# Patient Record
Sex: Female | Born: 1970 | Hispanic: Yes | Marital: Married | Smoking: Never smoker
Health system: Southern US, Community
[De-identification: ages and names within clinical notes are randomized; demographics above are authoritative.]

## PROBLEM LIST (undated history)

## (undated) DIAGNOSIS — T7840XA Allergy, unspecified, initial encounter: Secondary | ICD-10-CM

## (undated) HISTORY — DX: Allergy, unspecified, initial encounter: T78.40XA

---

## 2008-09-19 ENCOUNTER — Encounter: Admission: RE | Admit: 2008-09-19 | Discharge: 2008-09-19 | Payer: Self-pay | Admitting: Specialist

## 2009-10-20 ENCOUNTER — Encounter: Admission: RE | Admit: 2009-10-20 | Discharge: 2009-10-20 | Payer: Self-pay | Admitting: Nephrology

## 2011-09-09 ENCOUNTER — Emergency Department (HOSPITAL_COMMUNITY)
Admission: EM | Admit: 2011-09-09 | Discharge: 2011-09-09 | Disposition: A | Discharge status: Home / Self Care | Payer: BC Managed Care – PPO | Attending: Emergency Medicine | Admitting: Emergency Medicine

## 2011-09-09 ENCOUNTER — Encounter (HOSPITAL_COMMUNITY): Payer: Self-pay | Admitting: *Deleted

## 2011-09-09 DIAGNOSIS — M79609 Pain in unspecified limb: Secondary | ICD-10-CM | POA: Insufficient documentation

## 2011-09-09 DIAGNOSIS — IMO0002 Reserved for concepts with insufficient information to code with codable children: Secondary | ICD-10-CM | POA: Insufficient documentation
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
## 2011-09-09 NOTE — Discharge Instructions (Signed)
Assault, General Assault includes any behavior, whether intentional or reckless, which results in bodily injury to another person and/or damage to property. Included in this would be any behavior, intentional or reckless, that by its nature would be understood (interpreted) by a reasonable person as intent to harm another person or to damage his/her property. Threats may be oral or written. They may be communicated through regular mail, computer, fax, or phone. These threats may be direct or implied. FORMS OF ASSAULT INCLUDE:  Physically assaulting a person. This includes physical threats to inflict physical harm as well as:   Slapping.   Hitting.   Poking.   Kicking.   Punching.   Pushing.   Arson.   Sabotage.   Equipment vandalism.   Damaging or destroying property.   Throwing or hitting objects.   Displaying a weapon or an object that appears to be a weapon in a threatening manner.   Carrying a firearm of any kind.   Using a weapon to harm someone.   Using greater physical size/strength to intimidate another.   Making intimidating or threatening gestures.   Bullying.   Hazing.   Intimidating, threatening, hostile, or abusive language directed toward another person.   It communicates the intention to engage in violence against that person. And it leads a reasonable person to expect that violent behavior may occur.   Stalking another person.  IF IT HAPPENS AGAIN:  Immediately call for emergency help (911 in [place]   If someone poses clear and immediate danger to you, seek legal authorities to have a protective or restraining order put in place.   Less threatening assaults can at least be reported to authorities.  STEPS TO TAKE IF A SEXUAL ASSAULT HAS HAPPENED  Go to an area of safety. This may include a shelter or staying with a friend. Stay away from the area where you have been attacked. A large percentage of sexual assaults are caused by a friend, relative  or associate.   If medications were given by your caregiver, take them as directed for the full length of time prescribed.   Only take over-the-counter or prescription medicines for pain, discomfort, or fever as directed by your caregiver.   If you have come in contact with a sexual disease, find out if you are to be tested again. If your caregiver is concerned about the HIV/AIDS virus, he/she may require you to have continued testing for several months.   For the protection of your privacy, test results can not be given over the phone. Make sure you receive the results of your test. If your test results are not back during your visit, make an appointment with your caregiver to find out the results. Do not assume everything is normal if you have not heard from your caregiver or the medical facility. It is important for you to follow up on all of your test results.   File appropriate papers with authorities. This is important in all assaults, even if it has occurred in a family or by a friend.  SEEK MEDICAL CARE IF:  You have new problems because of your injuries.   You have problems that may be because of the medicine you are taking, such as:   Rash.   Itching.   Swelling.   Trouble breathing.   You develop belly (abdominal) pain, feel sick to your stomach (nausea) or are vomiting.   You begin to run a temperature.   You need supportive care or referral to   a rape crisis center. These are centers with trained personnel who can help you get through this ordeal.  SEEK IMMEDIATE MEDICAL CARE IF:  You are afraid of being threatened, beaten, or abused. In [place] call 911.   You receive new injuries related to abuse.   You develop severe pain in any area injured in the assault or have any change in your condition that concerns you.   You faint or lose consciousness.   You develop chest pain or shortness of breath.  Document Released: 05/13/2005 Document Revised: 05/02/2011 Document  Reviewed: 12/30/2007 Exit[employer] Patient Information 2012 [employer]  RESOURCE GUIDE  Dental Problems  Patients with Medicaid: [facility]                     [facility] [address]                                           [address] Phone:  [phone]                                                  Phone:  [phone]  If unable to pay or uninsured, contact:  [facility] or [facility] to become qualified for the adult dental clinic.  Chronic Pain Problems Contact [facility]  [phone] Patients need to be referred by their primary care doctor.  Insufficient Money for Medicine Contact [employer] Way:  call "211" or [facility] 271-5999.  No Primary Care Doctor Call [facility]  832-8000 Other agencies that provide inexpensive medical care    [facility]  [phone]    [facility]  [phone]    [facility]  [phone]    [facility] Clinic  [phone]    [facility]  [phone]    [facility]  [phone]  Psychological Services [facility]  [phone] [facility]  [phone] [facility]   800 853-5163 (emergency services 641-4993)  Substance Abuse Resources [facility]  336-882-2125 [facility] 336-784-9470 [facility] 336-285-9073 [facility] 336-845-3988 Residential & Outpatient Substance Abuse Program  800-659-3381  Abuse/Neglect [place] Child Abuse Hotline (336) 641-3795 [place] Child Abuse Hotline 800-378-5315 (After Hours)  Emergency Shelter [facility] (336) 271-5985  [facility] (336) 275-9566 [facility] (704) 372-4663  MRSA Hotline #:   832-7006    [place] [facility]  [facility] [place]     [employer]                          [facility]  Dept. [address] [place]                       [address]      [address]  [place]                                                  [place]                            [place] Phone:  [phone]                                   Phone:  [phone]                 Phone:  [phone]  [facility] Phone:  [phone]  [place] Child Abuse Hotline (336) 342-1394 (336) 342-3537 (After Hours)   

## 2013-08-09 ENCOUNTER — Other Ambulatory Visit: Payer: Self-pay

## 2013-08-09 ENCOUNTER — Ambulatory Visit
Admission: RE | Admit: 2013-08-09 | Discharge: 2013-08-09 | Disposition: A | Discharge status: Home / Self Care | Payer: BC Managed Care – PPO | Source: Ambulatory Visit

## 2013-08-09 DIAGNOSIS — Z1231 Encounter for screening mammogram for malignant neoplasm of breast: Secondary | ICD-10-CM

## 2014-06-23 ENCOUNTER — Ambulatory Visit (INDEPENDENT_AMBULATORY_CARE_PROVIDER_SITE_OTHER): Payer: BLUE CROSS/BLUE SHIELD

## 2014-06-23 ENCOUNTER — Ambulatory Visit (INDEPENDENT_AMBULATORY_CARE_PROVIDER_SITE_OTHER): Payer: BLUE CROSS/BLUE SHIELD | Admitting: Emergency Medicine

## 2014-06-23 VITALS — BP 126/70 | HR 115 | Temp 99.3°F | Resp 16 | Ht <= 58 in | Wt 103.0 lb

## 2014-06-23 DIAGNOSIS — H938X3 Other specified disorders of ear, bilateral: Secondary | ICD-10-CM

## 2014-06-23 DIAGNOSIS — R0989 Other specified symptoms and signs involving the circulatory and respiratory systems: Secondary | ICD-10-CM

## 2014-06-23 DIAGNOSIS — J22 Unspecified acute lower respiratory infection: Secondary | ICD-10-CM

## 2014-06-23 DIAGNOSIS — J988 Other specified respiratory disorders: Secondary | ICD-10-CM

## 2014-06-23 MED ORDER — IPRATROPIUM BROMIDE 0.03 % NA SOLN: 2.0000 | Freq: Two times a day (BID) | NASAL | Status: DC

## 2014-06-23 MED ORDER — AZITHROMYCIN 250 MG PO TABS: ORAL_TABLET | ORAL | Status: DC

## 2014-06-23 NOTE — Progress Notes (Signed)
  Medical screening examination/treatment/procedure(s) were performed by non-physician practitioner and as supervising physician I was immediately available for consultation/collaboration.     

## 2014-06-23 NOTE — Progress Notes (Signed)
Subjective:    Patient ID: Tara Mullins, female    DOB: 04-Tara-1972, 44 y.o.   MRN: [id number]  HPI Patient presents for 2 days of productive cough and ear pain. Cough produces think yellow sputum and sounds like she is breathing differently. Feels pressure in ears and sinus. Thinks sounds are muted. Has additional sx of chest and nasal congestion, SOB, and feeling hot. Denies wheezing, CP, N/V, or HA. 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 Has tried steam bath on face with minimal relief. No h/o asthma or allergies. NKDA.   Review of Systems  Constitutional: Positive for fever. Negative for chills, activity change, appetite change and fatigue.  HENT: Positive for congestion, ear pain, hearing loss, rhinorrhea, sinus pressure, sneezing and sore throat. Negative for ear discharge, postnasal drip, tinnitus and trouble swallowing.   Eyes: Negative for pain, discharge, redness and itching.  Respiratory: Positive for cough and shortness of breath. Negative for apnea, chest tightness and wheezing.   Cardiovascular: Negative for chest pain and palpitations.  Gastrointestinal: Negative for nausea, vomiting and abdominal pain.  Allergic/Immunologic: Negative for environmental allergies and food allergies.  Neurological: Negative for dizziness, light-headedness and headaches.  Hematological: Positive for adenopathy.       Objective:   Physical Exam  Constitutional: She is oriented to person, place, and time. She appears well-nourished. No distress.  Blood pressure 126/70, pulse 115, temperature 99.3 F (37.4 C), temperature source Oral, resp. rate 16, height 4\' 10"  (1.473 m), weight 103 lb (46.72 kg), last menstrual period 05/09/2014, SpO2 94 %.  HENT:  Head: Normocephalic and atraumatic.  Right Ear: Ear canal normal. There is tenderness. No drainage or swelling. Tympanic membrane is erythematous. Tympanic membrane is not injected, not scarred, not perforated, not  retracted and not bulging. A middle ear effusion is present.  Left Ear: Ear canal normal. There is tenderness. No drainage or swelling. Tympanic membrane is not injected, not scarred, not perforated, not erythematous, not retracted and not bulging. A middle ear effusion is present.  Nose: Rhinorrhea present. Right sinus exhibits maxillary sinus tenderness. Right sinus exhibits no frontal sinus tenderness. Left sinus exhibits no maxillary sinus tenderness and no frontal sinus tenderness.  Mouth/Throat: Uvula is midline, oropharynx is clear and moist and mucous membranes are normal.  Eyes: Conjunctivae are normal. Pupils are equal, round, and reactive to light. Right eye exhibits no discharge. Left eye exhibits no discharge. No scleral icterus.  Neck: Normal range of motion. Neck supple. No thyromegaly present.  Cardiovascular: Normal rate, regular rhythm and normal heart sounds.  Exam reveals no gallop and no friction rub.   No murmur heard. Pulmonary/Chest: Effort normal. No respiratory distress. She has no decreased breath sounds. She has no wheezes. She has no rhonchi. She has rales (fine) in the right middle field and the right lower field. She exhibits no tenderness.  Abdominal: Soft. Bowel sounds are normal. She exhibits no distension. There is no tenderness.  Lymphadenopathy:    She has cervical adenopathy.  Neurological: She is alert and oriented to person, place, and time.  Skin: Skin is warm and dry. No rash noted. She is not diaphoretic. No erythema. No pallor.   [facility] reading (PRIMARY) by  Dr. Ouida Sills. No acute cardiopulmonary findings.     Assessment & Plan:  1. Lung crackles 2. Lower respiratory infection Plenty of fluid and rest. - DG Chest 2 View; Future - azithromycin (ZITHROMAX) 250 MG tablet; Take 2 tabs PO x 1  dose, then 1 tab PO QD x 4 days  Dispense: 6 tablet; Refill: 0 - wrote letter to return to work 06/25/14.  3. Ear fullness, bilateral - ipratropium (ATROVENT) 0.03  % nasal spray; Place 2 sprays into both nostrils 2 (two) times daily.  Dispense: 30 mL; Refill: 0   Edmon Magid PA-C  [facility] 06/23/2014 1:51 PM

## 2014-07-14 ENCOUNTER — Other Ambulatory Visit: Payer: Self-pay

## 2014-08-10 ENCOUNTER — Other Ambulatory Visit: Payer: Self-pay

## 2014-08-10 DIAGNOSIS — Z1231 Encounter for screening mammogram for malignant neoplasm of breast: Secondary | ICD-10-CM

## 2014-08-12 ENCOUNTER — Ambulatory Visit
Admission: RE | Admit: 2014-08-12 | Discharge: 2014-08-12 | Disposition: A | Discharge status: Home / Self Care | Payer: BLUE CROSS/BLUE SHIELD | Source: Ambulatory Visit | Attending: Family Medicine | Admitting: Family Medicine

## 2014-08-12 DIAGNOSIS — Z1231 Encounter for screening mammogram for malignant neoplasm of breast: Secondary | ICD-10-CM

## 2014-10-18 ENCOUNTER — Emergency Department (HOSPITAL_COMMUNITY)
Admission: EM | Admit: 2014-10-18 | Discharge: 2014-10-18 | Disposition: A | Discharge status: Home / Self Care | Payer: BLUE CROSS/BLUE SHIELD | Attending: Emergency Medicine | Admitting: Emergency Medicine

## 2014-10-18 ENCOUNTER — Encounter (HOSPITAL_COMMUNITY): Payer: Self-pay | Admitting: Cardiology

## 2014-10-18 ENCOUNTER — Emergency Department (HOSPITAL_COMMUNITY): Payer: BLUE CROSS/BLUE SHIELD

## 2014-10-18 DIAGNOSIS — S0990XA Unspecified injury of head, initial encounter: Secondary | ICD-10-CM | POA: Insufficient documentation

## 2014-10-18 DIAGNOSIS — S4992XA Unspecified injury of left shoulder and upper arm, initial encounter: Secondary | ICD-10-CM | POA: Insufficient documentation

## 2014-10-18 DIAGNOSIS — Y92410 Unspecified street and highway as the place of occurrence of the external cause: Secondary | ICD-10-CM | POA: Diagnosis not present

## 2014-10-18 DIAGNOSIS — S8001XA Contusion of right knee, initial encounter: Secondary | ICD-10-CM | POA: Diagnosis not present

## 2014-10-18 DIAGNOSIS — Y9389 Activity, other specified: Secondary | ICD-10-CM | POA: Insufficient documentation

## 2014-10-18 DIAGNOSIS — R51 Headache: Secondary | ICD-10-CM

## 2014-10-18 DIAGNOSIS — S161XXA Strain of muscle, fascia and tendon at neck level, initial encounter: Secondary | ICD-10-CM | POA: Diagnosis not present

## 2014-10-18 DIAGNOSIS — R519 Headache, unspecified: Secondary | ICD-10-CM

## 2014-10-18 DIAGNOSIS — Z79899 Other long term (current) drug therapy: Secondary | ICD-10-CM | POA: Diagnosis not present

## 2014-10-18 DIAGNOSIS — Z3202 Encounter for pregnancy test, result negative: Secondary | ICD-10-CM | POA: Insufficient documentation

## 2014-10-18 DIAGNOSIS — Y998 Other external cause status: Secondary | ICD-10-CM | POA: Diagnosis not present

## 2014-10-18 LAB — POC URINE PREG, ED: PREG TEST UR: NEGATIVE

## 2014-10-18 MED ORDER — NAPROXEN 500 MG PO TABS: 500.0000 mg | ORAL_TABLET | Freq: Two times a day (BID) | ORAL | Status: DC
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
## 2014-10-18 NOTE — ED Notes (Signed)
Patient states did not take anything for pain this morning.
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
## 2015-03-09 ENCOUNTER — Other Ambulatory Visit (HOSPITAL_COMMUNITY)
Admission: RE | Admit: 2015-03-09 | Discharge: 2015-03-09 | Disposition: A | Discharge status: Home / Self Care | Payer: BLUE CROSS/BLUE SHIELD | Source: Ambulatory Visit | Attending: Family Medicine | Admitting: Family Medicine

## 2015-03-09 ENCOUNTER — Other Ambulatory Visit: Payer: Self-pay | Admitting: Family Medicine

## 2015-03-09 DIAGNOSIS — Z01411 Encounter for gynecological examination (general) (routine) with abnormal findings: Secondary | ICD-10-CM | POA: Diagnosis not present

## 2015-03-14 ENCOUNTER — Other Ambulatory Visit: Payer: Self-pay | Admitting: Nurse Practitioner

## 2015-03-14 DIAGNOSIS — R102 Pelvic and perineal pain: Secondary | ICD-10-CM

## 2015-03-14 LAB — CYTOLOGY - PAP

## 2015-03-16 ENCOUNTER — Ambulatory Visit
Admission: RE | Admit: 2015-03-16 | Discharge: 2015-03-16 | Disposition: A | Discharge status: Home / Self Care | Payer: BLUE CROSS/BLUE SHIELD | Source: Ambulatory Visit | Attending: Nurse Practitioner | Admitting: Nurse Practitioner

## 2015-03-16 ENCOUNTER — Inpatient Hospital Stay (HOSPITAL_COMMUNITY)
Admission: EM | Admit: 2015-03-16 | Discharge: 2015-03-18 | DRG: 340 | Disposition: A | Discharge status: Home / Self Care | Payer: BLUE CROSS/BLUE SHIELD | Attending: Surgery | Admitting: Surgery

## 2015-03-16 ENCOUNTER — Encounter (HOSPITAL_COMMUNITY): Payer: Self-pay | Admitting: Neurology

## 2015-03-16 DIAGNOSIS — K353 Acute appendicitis with localized peritonitis, without perforation or gangrene: Secondary | ICD-10-CM

## 2015-03-16 DIAGNOSIS — R102 Pelvic and perineal pain: Secondary | ICD-10-CM

## 2015-03-16 DIAGNOSIS — K3580 Unspecified acute appendicitis: Secondary | ICD-10-CM | POA: Diagnosis present

## 2015-03-16 LAB — CBC
HCT: 40.5 % (ref 36.0–46.0)
Hemoglobin: 12.9 g/dL (ref 12.0–15.0)
MCH: 29.3 pg (ref 26.0–34.0)
MCHC: 31.9 g/dL (ref 30.0–36.0)
MCV: 92 fL (ref 78.0–100.0)
Platelets: 410 10*3/uL — ABNORMAL HIGH (ref 150–400)
RBC: 4.4 MIL/uL (ref 3.87–5.11)
RDW: 12.3 % (ref 11.5–15.5)
WBC: 6.1 10*3/uL (ref 4.0–10.5)

## 2015-03-16 LAB — COMPREHENSIVE METABOLIC PANEL
ALT: 25 U/L (ref 14–54)
AST: 17 U/L (ref 15–41)
Albumin: 3.6 g/dL (ref 3.5–5.0)
Alkaline Phosphatase: 204 U/L — ABNORMAL HIGH (ref 38–126)
Anion gap: 7 (ref 5–15)
BUN: 9 mg/dL (ref 6–20)
CHLORIDE: 107 mmol/L (ref 101–111)
CO2: 26 mmol/L (ref 22–32)
CREATININE: 0.5 mg/dL (ref 0.44–1.00)
Calcium: 9.3 mg/dL (ref 8.9–10.3)
GFR calc Af Amer: >60 mL/min (ref >60)
GFR calc non Af Amer: >60 mL/min (ref >60)
Glucose, Bld: 97 mg/dL (ref 65–99)
Potassium: 3.5 mmol/L (ref 3.5–5.1)
SODIUM: 140 mmol/L (ref 135–145)
Total Bilirubin: 0.3 mg/dL (ref 0.3–1.2)
Total Protein: 7.3 g/dL (ref 6.5–8.1)

## 2015-03-16 LAB — LIPASE, BLOOD: LIPASE: 26 U/L (ref 11–51)

## 2015-03-16 LAB — URINE MICROSCOPIC-ADD ON

## 2015-03-16 LAB — URINALYSIS, ROUTINE W REFLEX MICROSCOPIC
BILIRUBIN URINE: NEGATIVE
Glucose, UA: NEGATIVE mg/dL
Ketones, ur: NEGATIVE mg/dL
Leukocytes, UA: NEGATIVE
Nitrite: NEGATIVE
PH: 5.5 (ref 5.0–8.0)
Protein, ur: NEGATIVE mg/dL
SPECIFIC GRAVITY, URINE: 1.027 (ref 1.005–1.030)
UROBILINOGEN UA: 0.2 mg/dL (ref 0.0–1.0)

## 2015-03-16 LAB — I-STAT BETA HCG BLOOD, ED (MC, WL, AP ONLY)

## 2015-03-16 LAB — SURGICAL PCR SCREEN
MRSA, PCR: NEGATIVE
Staphylococcus aureus: NEGATIVE

## 2015-03-16 MED ORDER — ACETAMINOPHEN 650 MG RE SUPP: 650.0000 mg | Freq: Four times a day (QID) | RECTAL | Status: DC | PRN

## 2015-03-16 MED ORDER — MORPHINE SULFATE (PF) 2 MG/ML IV SOLN
1.0000 mg | INTRAVENOUS | Status: DC | PRN
  Administered 2015-03-17: 2 mg via INTRAVENOUS
  Filled 2015-03-16: qty 1

## 2015-03-16 MED ORDER — CEFTRIAXONE SODIUM 2 G IJ SOLR
2.0000 g | Freq: Once | INTRAMUSCULAR | Status: DC
Start: 2015-03-16 — End: 2015-03-17
  Filled 2015-03-16: qty 2

## 2015-03-16 MED ORDER — ENOXAPARIN SODIUM 40 MG/0.4ML ~~LOC~~ SOLN
40.0000 mg | Freq: Once | SUBCUTANEOUS | Status: AC
  Administered 2015-03-16: 40 mg via SUBCUTANEOUS
  Filled 2015-03-16: qty 0.4

## 2015-03-16 MED ORDER — IOPAMIDOL (ISOVUE-300) INJECTION 61%
100.0000 mL | Freq: Once | INTRAVENOUS | Status: AC | PRN
  Administered 2015-03-16: 100 mL via INTRAVENOUS

## 2015-03-16 MED ORDER — DIPHENHYDRAMINE HCL 50 MG/ML IJ SOLN: 25.0000 mg | Freq: Four times a day (QID) | INTRAMUSCULAR | Status: DC | PRN

## 2015-03-16 MED ORDER — DEXTROSE 5 % IV SOLN
2.0000 g | INTRAVENOUS | Status: DC
  Administered 2015-03-16: 2 g via INTRAVENOUS
  Filled 2015-03-16 (×2): qty 2

## 2015-03-16 MED ORDER — ONDANSETRON 4 MG PO TBDP: 4.0000 mg | ORAL_TABLET | Freq: Four times a day (QID) | ORAL | Status: DC | PRN

## 2015-03-16 MED ORDER — KCL IN DEXTROSE-NACL 20-5-0.45 MEQ/L-%-% IV SOLN
INTRAVENOUS | Status: DC
  Administered 2015-03-16 – 2015-03-17 (×2): via INTRAVENOUS
  Filled 2015-03-16 (×2): qty 1000

## 2015-03-16 MED ORDER — ACETAMINOPHEN 325 MG PO TABS
650.0000 mg | ORAL_TABLET | Freq: Four times a day (QID) | ORAL | Status: DC | PRN
  Administered 2015-03-16: 325 mg via ORAL
  Filled 2015-03-16: qty 2

## 2015-03-16 MED ORDER — ONDANSETRON HCL 4 MG/2ML IJ SOLN
4.0000 mg | Freq: Four times a day (QID) | INTRAMUSCULAR | Status: DC | PRN
  Administered 2015-03-17 (×2): 4 mg via INTRAVENOUS
  Filled 2015-03-16 (×2): qty 2

## 2015-03-16 MED ORDER — METRONIDAZOLE IN NACL 5-0.79 MG/ML-% IV SOLN
500.0000 mg | Freq: Three times a day (TID) | INTRAVENOUS | Status: DC
  Administered 2015-03-16 – 2015-03-17 (×3): 500 mg via INTRAVENOUS
  Filled 2015-03-16 (×4): qty 100

## 2015-03-16 MED ORDER — OXYCODONE HCL 5 MG PO TABS: 5.0000 mg | ORAL_TABLET | ORAL | Status: DC | PRN

## 2015-03-16 MED ORDER — DIPHENHYDRAMINE HCL 25 MG PO CAPS: 25.0000 mg | ORAL_CAPSULE | Freq: Four times a day (QID) | ORAL | Status: DC | PRN

## 2015-03-16 MED ORDER — SIMETHICONE 80 MG PO CHEW: 40.0000 mg | CHEWABLE_TABLET | Freq: Four times a day (QID) | ORAL | Status: DC | PRN

## 2015-03-16 MED ORDER — METRONIDAZOLE IN NACL 5-0.79 MG/ML-% IV SOLN
500.0000 mg | Freq: Once | INTRAVENOUS | Status: DC
  Filled 2015-03-16: qty 100

## 2015-03-16 NOTE — H&P (Signed)
[place] [birth year]  [id number].   Chief Complaint/Reason for Consult: acute appendicitis HPI: This is a 44 yo female who began having RLQ abdominal pain since Oct 10 around 9pm at night.  She saw her OB/GYN and they did an [place] which showed free pelvic fluid from possible ruptured cyst.  She has continued to have RLQ abdominal pain, but denies N/V or fevers.  She has not had diarrhea until today after her CT scan contrast.  She saw her ob/gyn NP again today and she was sent for a CT scan.  This revealed acute appendicitis, but no definite evidence of perforation.  She was sent to [facility] and we have been asked to see her for admission.  ROS : Please see HPI, otherwise all other systems are negative.  History reviewed. No pertinent family history.  Past Medical History  Diagnosis Date  . Allergy     History reviewed. No pertinent past surgical history.  Social History:  reports that she has never smoked. She does not have any smokeless tobacco history on file. She reports that she does not drink alcohol or use illicit drugs.  Allergies: No Known Allergies   (Not in a hospital admission)  Blood pressure 113/54, pulse 79, temperature 98.6 F (37 C), temperature source Oral, resp. rate 20, last menstrual period 02/02/2015, SpO2 96 %. Physical Exam: General: pleasant, WD, WN  female who is laying in bed in NAD HEENT: head is normocephalic, atraumatic.  Sclera are noninjected.  PERRL.  Ears and nose without any masses or lesions.  Mouth is pink and moist Heart: regular, rate, and rhythm.  Normal s1,s2. No obvious murmurs, gallops, or rubs noted.  Palpable radial and pedal pulses bilaterally Lungs: CTAB, no wheezes, rhonchi, or rales noted.  Respiratory effort nonlabored Abd: soft, mildly tender in RLQ, no rebounding or peritonitis, ND, +BS, no masses, hernias, or organomegaly MS: all 4 extremities are symmetrical with no cyanosis, clubbing, or edema. Skin: warm and dry with no  masses, lesions, or rashes Psych: A&Ox3 with an appropriate affect.    Results for orders placed or performed during the hospital encounter of 03/16/15 (from the past 48 hour(s))  Lipase, blood     Status: None   Collection Time: 03/16/15  1:00 PM  Result Value Ref Range   Lipase 26 11 - 51 U/L    Comment: Please note change in reference range.  Comprehensive metabolic panel     Status: Abnormal   Collection Time: 03/16/15  1:00 PM  Result Value Ref Range   Sodium 140 135 - 145 mmol/L   Potassium 3.5 3.5 - 5.1 mmol/L   Chloride 107 101 - 111 mmol/L   CO2 26 22 - 32 mmol/L   Glucose, Bld 97 65 - 99 mg/dL   BUN 9 6 - 20 mg/dL   Creatinine, Ser 0.50 0.44 - 1.00 mg/dL   Calcium 9.3 8.9 - 10.3 mg/dL   Total Protein 7.3 6.5 - 8.1 g/dL   Albumin 3.6 3.5 - 5.0 g/dL   AST 17 15 - 41 U/L   ALT 25 14 - 54 U/L   Alkaline Phosphatase 204 (H) 38 - 126 U/L   Total Bilirubin 0.3 0.3 - 1.2 mg/dL   GFR calc non Af Amer >60 >60 mL/min   GFR calc Af Amer >60 >60 mL/min    Comment: (NOTE) The eGFR has been calculated using the CKD EPI equation. This calculation has not been validated in all clinical situations. eGFR's persistently <60  mL/min signify possible Chronic Kidney Disease.    Anion gap 7 5 - 15  CBC     Status: Abnormal   Collection Time: 03/16/15  1:00 PM  Result Value Ref Range   WBC 6.1 4.0 - 10.5 K/uL   RBC 4.40 3.87 - 5.11 MIL/uL   Hemoglobin 12.9 12.0 - 15.0 g/dL   HCT 40.5 36.0 - 46.0 %   MCV 92.0 78.0 - 100.0 fL   MCH 29.3 26.0 - 34.0 pg   MCHC 31.9 30.0 - 36.0 g/dL   RDW 12.3 11.5 - 15.5 %   Platelets 410 (H) 150 - 400 K/uL  I-Stat beta hCG blood, ED ([facility], [facility], [facility] only)     Status: None   Collection Time: 03/16/15  1:06 PM  Result Value Ref Range   I-stat hCG, quantitative <5.0 <5 mIU/mL   Comment 3            Comment:   GEST. AGE      CONC.  (mIU/mL)   <=1 WEEK        5 - 50     2 WEEKS       50 - 500     3 WEEKS       100 - 10,000     4 WEEKS     1,000 -  30,000        FEMALE AND NON-PREGNANT FEMALE:     LESS THAN 5 mIU/mL    Ct Abdomen Pelvis W Contrast  03/16/2015  CLINICAL DATA:  Severe sharp pelvic pain for 10 days EXAM: CT ABDOMEN AND PELVIS WITH CONTRAST TECHNIQUE: Multidetector CT imaging of the abdomen and pelvis was performed using the standard protocol following bolus administration of intravenous contrast. CONTRAST:  142m ISOVUE-300 IOPAMIDOL (ISOVUE-300) INJECTION 61% COMPARISON:  None. FINDINGS: The lung bases are clear. The liver enhances with no focal abnormality and no ductal dilatation is seen. The gallbladder is somewhat contracted and no calcified gallstones are seen. The pancreas is unremarkable. The adrenal glands and spleen are unremarkable. The stomach is moderately fluid distended with no abnormality noted. The kidneys enhance with no calculus or mass and on delayed images, the pelvocaliceal systems are unremarkable, and the proximal ureters are normal in caliber. The abdominal aorta is normal in caliber. No adenopathy is seen. The urinary bladder is decompressed and cannot be well evaluated. The uterus is normal in size. However, there are low-attenuation structures within the right adnexa with an adjacent inflammatory process and some fluid. On coronal images this appears to be due to a dilated fluid-filled appendix extending toward the right adnexa, consistent with acute appendicitis. The appendix measures up to 10 mm in diameter, and no evidence of rupture is currently seen. There is a small amount of fluid in the left adnexa as well. No abnormality of the colon is noted. The terminal ileum is unremarkable. The lumbar vertebrae are normal alignment with normal intervertebral disc spaces. IMPRESSION: Dilated fluid filled appendix extending into the right adnexa with adjacent inflammatory change, consistent with acute appendicitis without current complication. Surgical consultation is recommended as soon as possible. These results  were called by telephone by me at the time of interpretation on 03/16/2015 at 10:39 am to Dr. ADelice Bison, who verbally acknowledged these results. Electronically Signed   By: PIvar DrapeM.D.   On: 03/16/2015 10:41       Assessment/Plan 1. Acute appendicitis -admit, IVFs, abx therapy with Rocephin/Flagyl -clear liquids tonight, NPO p MN,  and plan for lap appy tomorrow -her imaging has been reviewed with radiology as well -lovenox x 1 tonight and hold for OR tomorrow   Laia Wiley E 03/16/2015, 3:06 PM Pager: [phone]

## 2015-03-16 NOTE — ED Notes (Signed)
Pt here with appendicitis shown from CT scan done today. Has been having abd pain since last week. Denies vomiting. Pt is a x 4.

## 2015-03-16 NOTE — ED Notes (Signed)
Surgeon at bedside.  

## 2015-03-17 ENCOUNTER — Inpatient Hospital Stay (HOSPITAL_COMMUNITY): Payer: BLUE CROSS/BLUE SHIELD | Admitting: Anesthesiology

## 2015-03-17 ENCOUNTER — Encounter (HOSPITAL_COMMUNITY): Payer: Self-pay | Admitting: Anesthesiology

## 2015-03-17 ENCOUNTER — Encounter (HOSPITAL_COMMUNITY)
Admission: EM | Disposition: A | Discharge status: Home / Self Care | Payer: BLUE CROSS/BLUE SHIELD | Source: Home / Self Care

## 2015-03-17 HISTORY — PX: LAPAROSCOPIC APPENDECTOMY: SHX408

## 2015-03-17 LAB — BASIC METABOLIC PANEL
ANION GAP: 9 (ref 5–15)
BUN: 7 mg/dL (ref 6–20)
CALCIUM: 8.7 mg/dL — AB (ref 8.9–10.3)
CO2: 24 mmol/L (ref 22–32)
Chloride: 106 mmol/L (ref 101–111)
Creatinine, Ser: 0.63 mg/dL (ref 0.44–1.00)
GLUCOSE: 98 mg/dL (ref 65–99)
Potassium: 3.6 mmol/L (ref 3.5–5.1)
Sodium: 139 mmol/L (ref 135–145)

## 2015-03-17 LAB — CBC
HCT: 38.2 % (ref 36.0–46.0)
HEMOGLOBIN: 11.8 g/dL — AB (ref 12.0–15.0)
MCH: 28.6 pg (ref 26.0–34.0)
MCHC: 30.9 g/dL (ref 30.0–36.0)
MCV: 92.5 fL (ref 78.0–100.0)
PLATELETS: 391 10*3/uL (ref 150–400)
RBC: 4.13 MIL/uL (ref 3.87–5.11)
RDW: 12.4 % (ref 11.5–15.5)
WBC: 5 10*3/uL (ref 4.0–10.5)

## 2015-03-17 SURGERY — APPENDECTOMY, LAPAROSCOPIC
Anesthesia: General

## 2015-03-17 MED ORDER — PROPOFOL 10 MG/ML IV BOLUS
INTRAVENOUS | Status: DC | PRN
  Administered 2015-03-17: 30 mg via INTRAVENOUS
  Administered 2015-03-17: 110 mg via INTRAVENOUS
  Administered 2015-03-17: 40 mg via INTRAVENOUS

## 2015-03-17 MED ORDER — ONDANSETRON HCL 4 MG/2ML IJ SOLN
INTRAMUSCULAR | Status: DC | PRN
  Administered 2015-03-17: 4 mg via INTRAVENOUS

## 2015-03-17 MED ORDER — OXYCODONE HCL 5 MG PO TABS: 5.0000 mg | ORAL_TABLET | ORAL | Status: DC | PRN

## 2015-03-17 MED ORDER — DEXAMETHASONE SODIUM PHOSPHATE 4 MG/ML IJ SOLN
INTRAMUSCULAR | Status: DC | PRN
  Administered 2015-03-17: 4 mg via INTRAVENOUS

## 2015-03-17 MED ORDER — AMOXICILLIN-POT CLAVULANATE 875-125 MG PO TABS: 1.0000 | ORAL_TABLET | Freq: Two times a day (BID) | ORAL | Status: DC

## 2015-03-17 MED ORDER — OXYCODONE HCL 5 MG PO TABS: 5.0000 mg | ORAL_TABLET | Freq: Once | ORAL | Status: DC | PRN

## 2015-03-17 MED ORDER — FENTANYL CITRATE (PF) 100 MCG/2ML IJ SOLN
INTRAMUSCULAR | Status: DC | PRN
  Administered 2015-03-17: 50 ug via INTRAVENOUS
  Administered 2015-03-17: 150 ug via INTRAVENOUS
  Administered 2015-03-17: 50 ug via INTRAVENOUS

## 2015-03-17 MED ORDER — 0.9 % SODIUM CHLORIDE (POUR BTL) OPTIME
TOPICAL | Status: DC | PRN
  Administered 2015-03-17: 1000 mL

## 2015-03-17 MED ORDER — LIDOCAINE HCL (CARDIAC) 20 MG/ML IV SOLN
INTRAVENOUS | Status: DC | PRN
  Administered 2015-03-17: 50 mg via INTRAVENOUS

## 2015-03-17 MED ORDER — ACETAMINOPHEN 325 MG PO TABS: 650.0000 mg | ORAL_TABLET | Freq: Four times a day (QID) | ORAL | Status: DC | PRN

## 2015-03-17 MED ORDER — MIDAZOLAM HCL 5 MG/5ML IJ SOLN
INTRAMUSCULAR | Status: DC | PRN
  Administered 2015-03-17: 2 mg via INTRAVENOUS

## 2015-03-17 MED ORDER — LIDOCAINE HCL (CARDIAC) 20 MG/ML IV SOLN
INTRAVENOUS | Status: AC
  Filled 2015-03-17: qty 5

## 2015-03-17 MED ORDER — MIDAZOLAM HCL 2 MG/2ML IJ SOLN
INTRAMUSCULAR | Status: AC
  Filled 2015-03-17: qty 4

## 2015-03-17 MED ORDER — SUCCINYLCHOLINE CHLORIDE 20 MG/ML IJ SOLN
INTRAMUSCULAR | Status: DC | PRN
  Administered 2015-03-17: 40 mg via INTRAVENOUS
  Administered 2015-03-17: 60 mg via INTRAVENOUS

## 2015-03-17 MED ORDER — SODIUM CHLORIDE 0.9 % IR SOLN
Status: DC | PRN
  Administered 2015-03-17: 1000 mL

## 2015-03-17 MED ORDER — AMOXICILLIN-POT CLAVULANATE 875-125 MG PO TABS
1.0000 | ORAL_TABLET | Freq: Two times a day (BID) | ORAL | Status: DC
  Administered 2015-03-17 – 2015-03-18 (×3): 1 via ORAL
  Filled 2015-03-17 (×3): qty 1

## 2015-03-17 MED ORDER — BUPIVACAINE-EPINEPHRINE (PF) 0.5% -1:200000 IJ SOLN: INTRAMUSCULAR | Status: DC | PRN

## 2015-03-17 MED ORDER — ACETAMINOPHEN 325 MG PO TABS: 325.0000 mg | ORAL_TABLET | ORAL | Status: DC | PRN

## 2015-03-17 MED ORDER — BUPIVACAINE-EPINEPHRINE (PF) 0.5% -1:200000 IJ SOLN
INTRAMUSCULAR | Status: AC
  Filled 2015-03-17: qty 30

## 2015-03-17 MED ORDER — BUPIVACAINE-EPINEPHRINE (PF) 0.25% -1:200000 IJ SOLN
INTRAMUSCULAR | Status: AC
  Filled 2015-03-17: qty 30

## 2015-03-17 MED ORDER — BUPIVACAINE-EPINEPHRINE 0.5% -1:200000 IJ SOLN
INTRAMUSCULAR | Status: DC | PRN
Start: 2015-03-17 — End: 2015-03-17
  Administered 2015-03-17: 30 mL

## 2015-03-17 MED ORDER — OXYCODONE HCL 5 MG/5ML PO SOLN: 5.0000 mg | Freq: Once | ORAL | Status: DC | PRN

## 2015-03-17 MED ORDER — ACETAMINOPHEN 160 MG/5ML PO SOLN: 325.0000 mg | ORAL | Status: DC | PRN

## 2015-03-17 MED ORDER — PROPOFOL 10 MG/ML IV BOLUS
INTRAVENOUS | Status: AC
  Filled 2015-03-17: qty 20

## 2015-03-17 MED ORDER — ENOXAPARIN SODIUM 40 MG/0.4ML ~~LOC~~ SOLN
40.0000 mg | SUBCUTANEOUS | Status: DC
  Administered 2015-03-17: 40 mg via SUBCUTANEOUS
  Filled 2015-03-17: qty 0.4

## 2015-03-17 MED ORDER — KCL IN DEXTROSE-NACL 20-5-0.45 MEQ/L-%-% IV SOLN
INTRAVENOUS | Status: DC
  Filled 2015-03-17: qty 1000

## 2015-03-17 MED ORDER — FENTANYL CITRATE (PF) 250 MCG/5ML IJ SOLN
INTRAMUSCULAR | Status: AC
  Filled 2015-03-17: qty 5

## 2015-03-17 MED ORDER — MORPHINE SULFATE (PF) 2 MG/ML IV SOLN: 1.0000 mg | INTRAVENOUS | Status: DC | PRN

## 2015-03-17 MED ORDER — LACTATED RINGERS IV SOLN
INTRAVENOUS | Status: DC
  Administered 2015-03-17 (×2): via INTRAVENOUS

## 2015-03-17 MED ORDER — FENTANYL CITRATE (PF) 100 MCG/2ML IJ SOLN: 25.0000 ug | INTRAMUSCULAR | Status: DC | PRN

## 2015-03-17 SURGICAL SUPPLY — 38 items
APPLIER CLIP 5 13 M/L LIGAMAX5 (MISCELLANEOUS)
APPLIER CLIP ROT 10 11.4 M/L (STAPLE)
CANISTER SUCTION 2500CC (MISCELLANEOUS) ×2 IMPLANT
CHLORAPREP W/TINT 26ML (MISCELLANEOUS) ×2 IMPLANT
CLIP APPLIE 5 13 M/L LIGAMAX5 (MISCELLANEOUS) IMPLANT
CLIP APPLIE ROT 10 11.4 M/L (STAPLE) IMPLANT
COVER SURGICAL LIGHT HANDLE (MISCELLANEOUS) ×2 IMPLANT
CUTTER LINEAR ENDO 35 ART FLEX (STAPLE) IMPLANT
ELECT REM PT RETURN 9FT ADLT (ELECTROSURGICAL) ×2
ELECTRODE REM PT RTRN 9FT ADLT (ELECTROSURGICAL) ×1 IMPLANT
ENDOLOOP SUT PDS II  0 18 (SUTURE)
ENDOLOOP SUT PDS II 0 18 (SUTURE) IMPLANT
GLOVE BIOGEL PI IND STRL 7.5 (GLOVE) ×2 IMPLANT
GLOVE BIOGEL PI INDICATOR 7.5 (GLOVE) ×2
GLOVE SURG SIGNA 7.5 PF LTX (GLOVE) ×2 IMPLANT
GLOVE SURG SS PI 7.0 STRL IVOR (GLOVE) ×2 IMPLANT
GOWN STRL REUS W/ TWL LRG LVL3 (GOWN DISPOSABLE) ×2 IMPLANT
GOWN STRL REUS W/ TWL XL LVL3 (GOWN DISPOSABLE) ×1 IMPLANT
GOWN STRL REUS W/TWL LRG LVL3 (GOWN DISPOSABLE) ×2
GOWN STRL REUS W/TWL XL LVL3 (GOWN DISPOSABLE) ×1
KIT BASIN OR (CUSTOM PROCEDURE TRAY) ×2 IMPLANT
KIT ROOM TURNOVER OR (KITS) ×2 IMPLANT
LIQUID BAND (GAUZE/BANDAGES/DRESSINGS) ×2 IMPLANT
NS IRRIG 1000ML POUR BTL (IV SOLUTION) ×4 IMPLANT
PAD ARMBOARD 7.5X6 YLW CONV (MISCELLANEOUS) ×4 IMPLANT
POUCH SPECIMEN RETRIEVAL 10MM (ENDOMECHANICALS) ×4 IMPLANT
RELOAD CUTTER ETS 35MM STAND (ENDOMECHANICALS) IMPLANT
SCALPEL HARMONIC ACE (MISCELLANEOUS) ×2 IMPLANT
SET IRRIG TUBING LAPAROSCOPIC (IRRIGATION / IRRIGATOR) ×2 IMPLANT
SLEEVE ENDOPATH XCEL 5M (ENDOMECHANICALS) ×2 IMPLANT
SPECIMEN JAR SMALL (MISCELLANEOUS) ×2 IMPLANT
SUT MON AB 4-0 PC3 18 (SUTURE) ×2 IMPLANT
TOWEL OR 17X24 6PK STRL BLUE (TOWEL DISPOSABLE) ×2 IMPLANT
TOWEL OR 17X26 10 PK STRL BLUE (TOWEL DISPOSABLE) ×2 IMPLANT
TRAY LAPAROSCOPIC MC (CUSTOM PROCEDURE TRAY) ×2 IMPLANT
TROCAR XCEL BLUNT TIP 100MML (ENDOMECHANICALS) ×2 IMPLANT
TROCAR XCEL NON-BLD 5MMX100MML (ENDOMECHANICALS) ×2 IMPLANT
TUBING INSUFFLATION (TUBING) ×2 IMPLANT

## 2015-03-17 NOTE — Anesthesia Procedure Notes (Signed)
Procedure Name: Intubation Date/Time: 03/17/2015 9:01 AM Performed by: Jenne Campus Pre-anesthesia Checklist: Patient identified, Emergency Drugs available, Suction available, Patient being monitored and Timeout performed Patient Re-evaluated:Patient Re-evaluated prior to inductionOxygen Delivery Method: Circle system utilized Preoxygenation: Pre-oxygenation with 100% oxygen Intubation Type: IV induction Ventilation: Mask ventilation without difficulty Laryngoscope Size: Mac and 3 Grade View: Grade I Tube type: Oral Tube size: 7.0 mm Number of attempts: 1 Airway Equipment and Method: Stylet Placement Confirmation: ETT inserted through vocal cords under direct vision,  positive ETCO2,  CO2 detector and breath sounds checked- equal and bilateral Secured at: 20 cm Tube secured with: Tape Dental Injury: Teeth and Oropharynx as per pre-operative assessment

## 2015-03-17 NOTE — Op Note (Signed)
APPENDECTOMY LAPAROSCOPIC  Procedure Note  Tara Mullins 03/16/2015 - 03/17/2015   Pre-op Diagnosis: ruptured appendicitis     Post-op Diagnosis: same  Procedure(s): APPENDECTOMY LAPAROSCOPIC  Surgeon(s): Coralie Keens, MD  Anesthesia: General  Staff:  Circulator: Gilberto Better, RN Scrub Person: Rolan Bucco; Lars Pinks Circulator Assistant: Ma Rings, RN  Estimated Blood Loss: Minimal               Specimens: sent to path  Findings:  perforated appendix at the tip with a small, walled off abscess          [name]   Date: 03/17/2015  Time: 9:37 AM

## 2015-03-17 NOTE — Transfer of Care (Signed)
Immediate Anesthesia Transfer of Care Note  Patient: Tara Mullins  Procedure(s) Performed: Procedure(s): APPENDECTOMY LAPAROSCOPIC (N/A)  Patient Location: PACU  Anesthesia Type:General  Level of Consciousness: sedated and patient cooperative  Airway & Oxygen Therapy: Patient Spontanous Breathing and Patient connected to nasal cannula oxygen  Post-op Assessment: Report given to RN and Post -op Vital signs reviewed and stable  Post vital signs: Reviewed  Last Vitals:  Filed Vitals:   03/17/15 0757  BP: 138/57  Pulse: 78  Temp: 36.2 C  Resp: 17    Complications: No apparent anesthesia complications

## 2015-03-17 NOTE — Anesthesia Postprocedure Evaluation (Signed)
  Anesthesia Post-op Note  Patient: Tara Mullins  Procedure(s) Performed: Procedure(s): APPENDECTOMY LAPAROSCOPIC (N/A)  Patient Location: PACU  Anesthesia Type:General  Level of Consciousness: awake  Airway and Oxygen Therapy: Patient Spontanous Breathing  Post-op Pain: mild  Post-op Assessment: Post-op Vital signs reviewed, Patient's Cardiovascular Status Stable, Respiratory Function Stable, Patent Airway, No signs of Nausea or vomiting and Pain level controlled              Post-op Vital Signs: Reviewed and stable  Last Vitals:  Filed Vitals:   03/17/15 1015  BP: 122/59  Pulse: 81  Temp:   Resp: 18    Complications: No apparent anesthesia complications

## 2015-03-17 NOTE — Op Note (Signed)
NAMEMarland Mullins  GUSTAVIA, CARIE NO.:  [id number]  MEDICAL RECORD NO.:  [id number]  LOCATION:  6N21C                        FACILITY:  [facility]  PHYSICIAN:  Coralie Keens, M.D. DATE OF BIRTH:  [birth year]  DATE OF PROCEDURE:  03/17/2015 DATE OF DISCHARGE:                              OPERATIVE REPORT   PREOPERATIVE DIAGNOSIS:  Ruptured appendicitis.  POSTOPERATIVE DIAGNOSIS:  Ruptured appendicitis.  PROCEDURE:  Laparoscopic appendectomy.  SURGEON:  Coralie Keens, M.D.  ANESTHESIA:  General and 0.25% Marcaine.  ESTIMATED BLOOD LOSS:  Minimal.  INDICATIONS:  A 44 year old female, who presents with a 10-day history of right lower quadrant abdominal pain.  This was originally thought to be a ruptured ovarian cyst.  Because of her persistent pain, a CAT scan was performed showing findings consistent with appendicitis and a small periappendiceal abscess.  Decision was made to proceed to the operating room for laparoscopic appendectomy.  FINDINGS:  The patient was found to have appendicitis with perforation of the tip of the appendix and a small abscess that was contained in the retroperitoneum that had turbid-appearing fluid.  No other intraabdominal abnormalities were identified.  PROCEDURE IN DETAIL:  The patient was brought to the operating room, identified as Tara Mullins.  She was placed supine on the operating room table and general anesthesia was induced.  Her abdomen was then prepped and draped in the usual sterile fashion.  Using a #15 blade, a small vertical incision was made below the umbilicus.  This was carried down the fascia, which was then opened scalpel.  Hemostat was used to pass into the peritoneal cavity under direct vision.  A #0 Vicryl pursestring suture was then placed around the fascial opening.  The Hasson port was placed through the opening and insufflation of the abdomen was begun.  A 5-mm port was then placed in the patient's right upper  quadrant and another in the left lower quadrant all under direct vision.  The appendix was identified in the right lower quadrant.  It was adhered to the retroperitoneum and covered with omentum.  I was able to free the omentum off the appendix.  The base of the appendix was normal; however, the rest of the appendix was quite inflamed and stuck to the retroperitoneum.  I was able to take down the mesoappendix with the Harmonic Scalpel and then transect the base of the appendix with the endoscopic GIA stapler.  I was able to peel the appendix out of the retroperitoneum.  I entered a very small abscess cavity, which had turbid fluid, but no gross purulence.  Once it appeared that the appendix had ruptured just at the tip.  At this point, the appendix was completely free from retroperitoneum with blunt dissection.  I then placed the appendix in an Endosac and removed through the incision at the umbilicus.  I then copiously irrigated the abdomen with a liter of normal saline.  Hemostasis appeared to be achieved in the retroperitoneum as well as at the appendiceal stump.  At this point, I elected not to leave a drain.  Hemostasis appeared to be achieved.  All ports were removed under direct vision and the abdomen was deflated.  The #0 Vicryl was tied in place at the umbilicus, closing the fascial defect.  All incisions were then anesthetized with Marcaine and closed with 4-0 Monocryl sutures.  Skin glue was then applied.  The patient tolerated the procedure well.  All the counts were correct at the end of procedure.  The patient was then extubated in the operating room and taken in a stable condition to recovery room.     Coralie Keens, M.D.     DB/MEDQ  D:  03/17/2015  T:  03/17/2015  Job:  [id number]

## 2015-03-17 NOTE — ED Provider Notes (Signed)
CSN: [id number]     Arrival date & time 03/16/15  1232 History   First MD Initiated Contact with Patient 03/16/15 1350     Chief Complaint  Patient presents with  . Abdominal Pain     (Consider location/radiation/quality/duration/timing/severity/associated sxs/prior Treatment) HPI   Tara Mullins is a 44 y.o F with no significant past medical history who presents the emergency department today from an outpatient CT imaging that revealed appendicitis. Patient states that on October 10 and she began experiencing right lower quadrant abdominal pain and was seen in the emergency department for this patient had a transvaginal ultrasound that revealed a ruptured ovarian cyst and she followed up with her PCP on October 13. She had a Pap smear at this time which was normal. Patient still with continued abdominal pain saw her PCP again on Monday and was referred to OB/GYN. Her OB/GYN ordered her a CT scan that was performed today as an outpatient. Patient states she was called by her OB/GYN with the results of the CT scan that showed appendicitis and was told to come to the emergency Department immediately. Patient still complaining of right lower quadrant abdominal pain, anorexia, nausea. Denies fever, vomiting, diarrhea, chest pain, shortness of breath, dysuria.  Past Medical History  Diagnosis Date  . Allergy    History reviewed. No pertinent past surgical history. History reviewed. No pertinent family history. Social History  Substance Use Topics  . Smoking status: Never Smoker   . Smokeless tobacco: None  . Alcohol Use: No   OB History    No data available     Review of Systems  All other systems reviewed and are negative.     Allergies  Review of patient's allergies indicates no known allergies.  Home Medications   Prior to Admission medications   Medication Sig Start Date End Date Taking? Authorizing Provider  Ascorbic Acid (VITAMIN C) 500 MG CHEW Chew 1 tablet by mouth  daily.   Yes Historical Provider, MD  cetirizine (ZYRTEC) 10 MG tablet Take 10 mg by mouth daily.   Yes Historical Provider, MD  Cyanocobalamin (VITAMIN B12 PO) Take 1 tablet by mouth daily.   Yes Historical Provider, MD  hyoscyamine (LEVSIN, ANASPAZ) 0.125 MG tablet Take 1 tablet by mouth every 6 (six) hours as needed. cramping 03/07/15  Yes Historical Provider, MD  Multiple Vitamins-Minerals (HM MULTIVITAMIN ADULT GUMMY PO) Take 1 tablet by mouth daily.   Yes Historical Provider, MD  traMADol-acetaminophen (ULTRACET) 37.5-325 MG tablet Take 1 tablet by mouth every 6 (six) hours as needed. pain 03/07/15  Yes Historical Provider, MD   BP 114/58 mmHg  Pulse 71  Temp(Src) 98.4 F (36.9 C) (Axillary)  Resp 20  Ht 4\' 10"  (1.473 m)  Wt 100 lb (45.36 kg)  BMI 20.91 kg/m2  SpO2 97%  LMP 02/02/2015 Physical Exam  Constitutional: She is oriented to person, place, and time. She appears well-developed and well-nourished. No distress.  HENT:  Head: Normocephalic and atraumatic.  Mouth/Throat: Oropharynx is clear and moist. No oropharyngeal exudate.  Eyes: Conjunctivae and EOM are normal. Pupils are equal, round, and reactive to light. Right eye exhibits no discharge. Left eye exhibits no discharge. No scleral icterus.  Neck: Normal range of motion. Neck supple.  Cardiovascular: Normal rate, regular rhythm, normal heart sounds and intact distal pulses.  Exam reveals no gallop and no friction rub.   No murmur heard. Pulmonary/Chest: Effort normal and breath sounds normal. No respiratory distress. She has no wheezes. She  has no rales. She exhibits no tenderness.  Abdominal: Soft. Bowel sounds are normal. She exhibits no distension. There is tenderness. There is rebound and guarding.  Positive Rosvings sign, rebound.   Musculoskeletal: Normal range of motion. She exhibits no edema or tenderness.  Lymphadenopathy:    She has no cervical adenopathy.  Neurological: She is alert and oriented to person,  place, and time. No cranial nerve deficit.  Strength 5/5 throughout. No sensory deficits.    Skin: Skin is warm and dry. No rash noted. She is not diaphoretic. No erythema. No pallor.  Psychiatric: She has a normal mood and affect. Her behavior is normal.  Nursing note and vitals reviewed.   ED Course  Procedures (including critical care time) Labs Review Labs Reviewed  COMPREHENSIVE METABOLIC PANEL - Abnormal; Notable for the following:    Alkaline Phosphatase 204 (*)    All other components within normal limits  CBC - Abnormal; Notable for the following:    Platelets 410 (*)    All other components within normal limits  URINALYSIS, ROUTINE W REFLEX MICROSCOPIC (NOT AT [facility]) - Abnormal; Notable for the following:    Hgb urine dipstick SMALL (*)    All other components within normal limits  BASIC METABOLIC PANEL - Abnormal; Notable for the following:    Calcium 8.7 (*)    All other components within normal limits  CBC - Abnormal; Notable for the following:    Hemoglobin 11.8 (*)    All other components within normal limits  SURGICAL PCR SCREEN  LIPASE, BLOOD  URINE MICROSCOPIC-ADD ON  I-STAT BETA HCG BLOOD, ED ([facility], [facility], [facility] ONLY)  SURGICAL PATHOLOGY    Imaging Review Ct Abdomen Pelvis W Contrast  03/16/2015  CLINICAL DATA:  Severe sharp pelvic pain for 10 days EXAM: CT ABDOMEN AND PELVIS WITH CONTRAST TECHNIQUE: Multidetector CT imaging of the abdomen and pelvis was performed using the standard protocol following bolus administration of intravenous contrast. CONTRAST:  146mL ISOVUE-300 IOPAMIDOL (ISOVUE-300) INJECTION 61% COMPARISON:  None. FINDINGS: The lung bases are clear. The liver enhances with no focal abnormality and no ductal dilatation is seen. The gallbladder is somewhat contracted and no calcified gallstones are seen. The pancreas is unremarkable. The adrenal glands and spleen are unremarkable. The stomach is moderately fluid distended with no abnormality noted. The  kidneys enhance with no calculus or mass and on delayed images, the pelvocaliceal systems are unremarkable, and the proximal ureters are normal in caliber. The abdominal aorta is normal in caliber. No adenopathy is seen. The urinary bladder is decompressed and cannot be well evaluated. The uterus is normal in size. However, there are low-attenuation structures within the right adnexa with an adjacent inflammatory process and some fluid. On coronal images this appears to be due to a dilated fluid-filled appendix extending toward the right adnexa, consistent with acute appendicitis. The appendix measures up to 10 mm in diameter, and no evidence of rupture is currently seen. There is a small amount of fluid in the left adnexa as well. No abnormality of the colon is noted. The terminal ileum is unremarkable. The lumbar vertebrae are normal alignment with normal intervertebral disc spaces. IMPRESSION: Dilated fluid filled appendix extending into the right adnexa with adjacent inflammatory change, consistent with acute appendicitis without current complication. Surgical consultation is recommended as soon as possible. These results were called by telephone by me at the time of interpretation on 03/16/2015 at 10:39 am to Dr. Delice Bison , who verbally acknowledged these results. Electronically  Signed   By: Ivar Drape M.D.   On: 03/16/2015 10:41   I have personally reviewed and evaluated these images and lab results as part of my medical decision-making.   EKG Interpretation None      MDM   Final diagnoses:  Acute appendicitis with localized peritonitis    Pt sent from out patient CT scan that revealed a dilated fluid-filled appendix extending into the right adnexa with adjacent inflammatory change consistent with acute appendicitis without current complication. Patient is afebrile. WBC within normal limits.  Surgery was contacted immediately. Dr. Rush Farmer and the surgical PA Claiborne Billings will consult patient  in ED as soon as possible. Patient will be taken to surgery.  Patient in no apparent distress. Vital signs stable.    Dondra Spry Rossie, PA-C 03/17/15 1142  Lacretia Leigh, MD 03/28/15 [phone]

## 2015-03-17 NOTE — Anesthesia Preprocedure Evaluation (Addendum)
Anesthesia Evaluation  Patient identified by MRN, date of birth, ID band Patient awake    Reviewed: Allergy & Precautions, NPO status , Patient's Chart, lab work & pertinent test results  History of Anesthesia Complications Negative for: history of anesthetic complications  Airway Mallampati: II  TM Distance: >3 FB Neck ROM: Full    Dental  (+) Teeth Intact, Dental Advisory Given   Pulmonary neg pulmonary ROS,    breath sounds clear to auscultation       Cardiovascular negative cardio ROS   Rhythm:Regular Rate:Normal     Neuro/Psych negative neurological ROS  negative psych ROS   GI/Hepatic negative GI ROS, Neg liver ROS,   Endo/Other  negative endocrine ROS  Renal/GU negative Renal ROS     Musculoskeletal negative musculoskeletal ROS (+)   Abdominal   Peds  Hematology negative hematology ROS (+)   Anesthesia Other Findings   Reproductive/Obstetrics negative OB ROS                             Anesthesia Physical Anesthesia Plan  ASA: I  Anesthesia Plan: General   Post-op Pain Management:    Induction: Intravenous  Airway Management Planned: Oral ETT  Additional Equipment: None  Intra-op Plan:   Post-operative Plan: Extubation in OR  Informed Consent: I have reviewed the patients History and Physical, chart, labs and discussed the procedure including the risks, benefits and alternatives for the proposed anesthesia with the patient or authorized representative who has indicated his/her understanding and acceptance.   Dental advisory given  Plan Discussed with: CRNA, Anesthesiologist and Surgeon  Anesthesia Plan Comments:        Anesthesia Quick Evaluation

## 2015-03-17 NOTE — Care Management Note (Signed)
Case Management Note  Patient Details  Name: May Chantel Teti MRN: [id number] Date of Birth: 28-Aug-1970  Subjective/Objective:                    Action/Plan:  Initial UR completed  Expected Discharge Date:                  Expected Discharge Plan:  Home/Self Care  In-House Referral:     Discharge planning Services     Post Acute Care Choice:    Choice offered to:     DME Arranged:    DME Agency:     HH Arranged:    [place] Agency:     Status of Service:  In process, will continue to follow  Medicare Important Message Given:    Date Medicare IM Given:    Medicare IM give by:    Date Additional Medicare IM Given:    Additional Medicare Important Message give by:     If discussed at [place] of Stay Meetings, dates discussed:    Additional Comments:  Marilu Favre, RN 03/17/2015, 3:09 PM

## 2015-03-18 MED ORDER — TRAMADOL HCL 50 MG PO TABS: 50.0000 mg | ORAL_TABLET | Freq: Four times a day (QID) | ORAL | Status: DC | PRN

## 2015-03-18 NOTE — Discharge Instructions (Signed)
Appendicitis Appendicitis is when the appendix is swollen (inflamed). The inflammation can lead to developing a hole (perforation) and a collection of pus (abscess). CAUSES  There is not always an obvious cause of appendicitis. Sometimes it is caused by an obstruction in the appendix. The obstruction can be caused by:  A small, hard, pea-sized ball of stool (fecalith).  Enlarged lymph glands in the appendix. SYMPTOMS   Pain around your belly button (navel) that moves toward your lower right belly (abdomen). The pain can become more severe and sharp as time passes.  Tenderness in the lower right abdomen. Pain gets worse if you cough or make a sudden movement.  Feeling sick to your stomach (nauseous).  Throwing up (vomiting).  Loss of appetite.  Fever.  Constipation.  Diarrhea.  Generally not feeling well. DIAGNOSIS   Physical exam.  Blood tests.  Urine test.  X-rays or a CT scan may confirm the diagnosis. TREATMENT  Once the diagnosis of appendicitis is made, the most common treatment is to remove the appendix as soon as possible. This procedure is called appendectomy. In an open appendectomy, a cut (incision) is made in the lower right abdomen and the appendix is removed. In a laparoscopic appendectomy, usually 3 small incisions are made. Long, thin instruments and a camera tube are used to remove the appendix. Most patients go home in 24 to 48 hours after appendectomy. In some situations, the appendix may have already perforated and an abscess may have formed. The abscess may have a "wall" around it as seen on a CT scan. In this case, a drain may be placed into the abscess to remove fluid, and you may be treated with antibiotic medicines that kill germs. The medicine is given through a tube in your vein (IV). Once the abscess has resolved, it may or may not be necessary to have an appendectomy. You may need to stay in the hospital longer than 48 hours.     LAPAROSCOPIC  SURGERY: POST OP INSTRUCTIONS  1. DIET: Follow a light bland diet the first 24 hours after arrival home, such as soup, liquids, crackers, etc.  Be sure to include lots of fluids daily.  Avoid fast food or heavy meals as your are more likely to get nauseated.  Eat a low fat the next few days after surgery.   2. Take your usually prescribed home medications unless otherwise directed. 3. PAIN CONTROL: a. Pain is best controlled by a usual combination of three different methods TOGETHER: i. Ice/Heat ii. Over the counter pain medication iii. Prescription pain medication b. Most patients will experience some swelling and bruising around the incisions.  Ice packs or heating pads (30-60 minutes up to 6 times a day) will help. Use ice for the first few days to help decrease swelling and bruising, then switch to heat to help relax tight/sore spots and speed recovery.  Some people prefer to use ice alone, heat alone, alternating between ice & heat.  Experiment to what works for you.  Swelling and bruising can take several weeks to resolve.   c. It is helpful to take an over-the-counter pain medication regularly for the first few weeks.  Choose one of the following that works best for you: i. Naproxen (Aleve, etc)  Two 220mg  tabs twice a day ii. Ibuprofen (Advil, etc) Three 200mg  tabs four times a day (every meal & bedtime) d. A  prescription for pain medication (such as percocet, vicodin, oxycodone, hydrocodone, etc) should be given to you upon  discharge.  Take your pain medication as prescribed.  i. If you are having problems/concerns with the prescription medicine (does not control pain, nausea, vomiting, rash, itching, etc), please call us [phone] to see if we need to switch you to a different pain medicine that will work better for you and/or control your side effect better. ii. If you need a refill on your pain medication, please contact your pharmacy.  They will contact our office to request  authorization. Prescriptions will not be filled after 5 pm or on week-ends.   4. Avoid getting constipated.  Between the surgery and the pain medications, it is common to experience some constipation.  Increasing fluid intake and taking a fiber supplement (such as Metamucil, Citrucel, FiberCon, MiraLax, etc) 1-2 times a day regularly will usually help prevent this problem from occurring.  A mild laxative (prune juice, Milk of Magnesia, MiraLax, etc) should be taken according to package directions if there are no bowel movements after 48 hours.   5. Watch out for diarrhea.  If you have many loose bowel movements, simplify your diet to bland foods & liquids for a few days.  Stop any stool softeners and decrease your fiber supplement.  Switching to mild anti-diarrheal medications (Kayopectate, Pepto Bismol) can help.  If this worsens or does not improve, please call us. 6. Wash / shower every day.  You may shower over the dressings as they are waterproof.  Continue to shower over incision(s) after the dressing is off. 7. Remove your waterproof bandages 5 days after surgery.  You may leave the incision open to air.  You may replace a dressing/Band-Aid to cover the incision for comfort if you wish.  8. ACTIVITIES as tolerated:   a. You may resume regular (light) daily activities beginning the next day--such as daily self-care, walking, climbing stairs--gradually increasing activities as tolerated.  If you can walk 30 minutes without difficulty, it is safe to try more intense activity such as jogging, treadmill, bicycling, low-impact aerobics, swimming, etc. b. Save the most intensive and strenuous activity for last such as sit-ups, heavy lifting, contact sports, etc  Refrain from any heavy lifting or straining until you are off narcotics for pain control.   c. DO NOT PUSH THROUGH PAIN.  Let pain be your guide: If it hurts to do something, don't do it.  Pain is your body warning you to avoid that activity for  another week until the pain goes down. d. You may drive when you are no longer taking prescription pain medication, you can comfortably wear a seatbelt, and you can safely maneuver your car and apply brakes. e. Dennis Bast may have sexual intercourse when it is comfortable.  9. FOLLOW UP in our office a. Please call [facility] at [phone] to set up an appointment to see your surgeon in the office for a follow-up appointment approximately 2-3 weeks after your surgery. b. Make sure that you call for this appointment the day you arrive home to insure a convenient appointment time. 10. IF YOU HAVE DISABILITY OR FAMILY LEAVE FORMS, BRING THEM TO THE OFFICE FOR PROCESSING.  DO NOT GIVE THEM TO YOUR DOCTOR.   WHEN TO CALL us [phone]: 1. Poor pain control 2. Reactions / problems with new medications (rash/itching, nausea, etc)  3. Fever over 101.5 F (38.5 C) 4. Inability to urinate 5. Nausea and/or vomiting 6. Worsening swelling or bruising 7. Continued bleeding from incision. 8. Increased pain, redness, or drainage from the incision  The clinic staff is available to answer your questions during regular business hours (8:30am-5pm).  Please dont hesitate to call and ask to speak to one of our nurses for clinical concerns.   If you have a medical emergency, go to the nearest emergency room or call 911.  A surgeon from [facility] is always on call at the [facility], [place], [place], [place], [place]  [address] ? MAIN: [phone] ? TOLL FREE: [phone] ?  FAX [phone] [email or web address]

## 2015-03-18 NOTE — Discharge Summary (Signed)
Physician Discharge Summary  Patient ID: Tara Mullins MRN: [id number] DOB/AGE: 30-Jul-1970 44 y.o.  Admit date: 03/16/2015 Discharge date: 03/18/2015  Admission Diagnoses: Appendicitis  Discharge Diagnoses:  Principal Problem:   Acute appendicitis   Discharged Condition: good  Hospital Course: patient admitted and laparoscopic appendectomy performed.  Patient had some issues with nausea that night, but this was improved the following am.  She was tolerating a diet and ambulating without difficulty.  She would like to use Tramadol at home for pain.    Consults: None  Significant Diagnostic Studies: labs: cbc, chemistry, CT scan  Treatments: IV hydration, analgesia: acetaminophen w/ codeine and surgery: lap appendectomy  Discharge Exam: Blood pressure 102/40, pulse 65, temperature 98 F (36.7 C), temperature source Oral, resp. rate 18, height 4\' 10"  (1.473 m), weight 45.36 kg (100 lb), last menstrual period 02/02/2015, SpO2 99 %. General appearance: cooperative GI: soft, non-distended Incision/Wound: clean, dry, intact  Disposition: 01-Home or Self Care     Medication List    STOP taking these medications        hyoscyamine 0.125 MG tablet  Commonly known as:  LEVSIN, ANASPAZ      TAKE these medications        acetaminophen 325 MG tablet  Commonly known as:  TYLENOL  Take 2 tablets (650 mg total) by mouth every 6 (six) hours as needed for mild pain (or temp > 100).     amoxicillin-clavulanate 875-125 MG tablet  Commonly known as:  AUGMENTIN  Take 1 tablet by mouth every 12 (twelve) hours.     cetirizine 10 MG tablet  Commonly known as:  ZYRTEC  Take 10 mg by mouth daily.     HM MULTIVITAMIN ADULT GUMMY PO  Take 1 tablet by mouth daily.     traMADol-acetaminophen 37.5-325 MG tablet  Commonly known as:  ULTRACET  Take 1 tablet by mouth every 6 (six) hours as needed. pain     VITAMIN B12 PO  Take 1 tablet by mouth daily.     Vitamin C 500 MG Chew   Chew 1 tablet by mouth daily.           Follow-up Information    Follow up with [facility], MD. Schedule an appointment as soon as possible for a visit in 2 weeks.   Specialty:  General Surgery   Contact information:   [address] [address] [phone]       Signed: Rosario Mullins 16/11/3708, 9:27 AM

## 2015-03-20 ENCOUNTER — Encounter (HOSPITAL_COMMUNITY): Payer: Self-pay | Admitting: Surgery

## 2015-08-15 ENCOUNTER — Other Ambulatory Visit: Payer: Self-pay

## 2015-08-15 DIAGNOSIS — Z1231 Encounter for screening mammogram for malignant neoplasm of breast: Secondary | ICD-10-CM

## 2015-08-24 ENCOUNTER — Ambulatory Visit
Admission: RE | Admit: 2015-08-24 | Discharge: 2015-08-24 | Disposition: A | Discharge status: Home / Self Care | Payer: BLUE CROSS/BLUE SHIELD | Source: Ambulatory Visit

## 2015-08-24 DIAGNOSIS — Z1231 Encounter for screening mammogram for malignant neoplasm of breast: Secondary | ICD-10-CM

## 2016-05-27 HISTORY — PX: BREAST BIOPSY: SHX20

## 2016-08-20 ENCOUNTER — Other Ambulatory Visit: Payer: Self-pay | Admitting: Family Medicine

## 2016-08-20 DIAGNOSIS — Z1231 Encounter for screening mammogram for malignant neoplasm of breast: Secondary | ICD-10-CM

## 2016-09-11 ENCOUNTER — Ambulatory Visit
Admission: RE | Admit: 2016-09-11 | Discharge: 2016-09-11 | Disposition: A | Discharge status: Home / Self Care | Payer: BLUE CROSS/BLUE SHIELD | Source: Ambulatory Visit | Attending: Family Medicine | Admitting: Family Medicine

## 2016-09-11 DIAGNOSIS — Z1231 Encounter for screening mammogram for malignant neoplasm of breast: Secondary | ICD-10-CM | POA: Diagnosis not present

## 2016-10-28 DIAGNOSIS — J3089 Other allergic rhinitis: Secondary | ICD-10-CM | POA: Diagnosis not present

## 2016-10-28 DIAGNOSIS — J028 Acute pharyngitis due to other specified organisms: Secondary | ICD-10-CM | POA: Diagnosis not present

## 2016-10-28 DIAGNOSIS — J0100 Acute maxillary sinusitis, unspecified: Secondary | ICD-10-CM | POA: Diagnosis not present

## 2016-11-22 DIAGNOSIS — J309 Allergic rhinitis, unspecified: Secondary | ICD-10-CM | POA: Diagnosis not present

## 2016-11-28 ENCOUNTER — Ambulatory Visit (INDEPENDENT_AMBULATORY_CARE_PROVIDER_SITE_OTHER): Payer: 59

## 2016-11-28 ENCOUNTER — Ambulatory Visit (INDEPENDENT_AMBULATORY_CARE_PROVIDER_SITE_OTHER): Payer: 59 | Admitting: Orthopaedic Surgery

## 2016-11-28 DIAGNOSIS — G8929 Other chronic pain: Secondary | ICD-10-CM | POA: Diagnosis not present

## 2016-11-28 DIAGNOSIS — M25561 Pain in right knee: Secondary | ICD-10-CM | POA: Diagnosis not present
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
## 2017-01-14 DIAGNOSIS — Z803 Family history of malignant neoplasm of breast: Secondary | ICD-10-CM | POA: Diagnosis not present

## 2017-01-14 DIAGNOSIS — R05 Cough: Secondary | ICD-10-CM | POA: Diagnosis not present

## 2017-01-14 DIAGNOSIS — H1045 Other chronic allergic conjunctivitis: Secondary | ICD-10-CM | POA: Diagnosis not present

## 2017-01-14 DIAGNOSIS — J309 Allergic rhinitis, unspecified: Secondary | ICD-10-CM | POA: Diagnosis not present

## 2017-01-14 DIAGNOSIS — N6310 Unspecified lump in the right breast, unspecified quadrant: Secondary | ICD-10-CM | POA: Diagnosis not present

## 2017-01-15 ENCOUNTER — Other Ambulatory Visit: Payer: Self-pay | Admitting: Family Medicine

## 2017-01-15 DIAGNOSIS — N6310 Unspecified lump in the right breast, unspecified quadrant: Secondary | ICD-10-CM

## 2017-01-21 ENCOUNTER — Ambulatory Visit
Admission: RE | Admit: 2017-01-21 | Discharge: 2017-01-21 | Disposition: A | Discharge status: Home / Self Care | Payer: 59 | Source: Ambulatory Visit | Attending: Family Medicine | Admitting: Family Medicine

## 2017-01-21 ENCOUNTER — Other Ambulatory Visit: Payer: Self-pay | Admitting: Family Medicine

## 2017-01-21 DIAGNOSIS — N6310 Unspecified lump in the right breast, unspecified quadrant: Secondary | ICD-10-CM

## 2017-01-21 DIAGNOSIS — N6314 Unspecified lump in the right breast, lower inner quadrant: Secondary | ICD-10-CM | POA: Diagnosis not present

## 2017-01-21 DIAGNOSIS — R922 Inconclusive mammogram: Secondary | ICD-10-CM | POA: Diagnosis not present

## 2017-01-28 ENCOUNTER — Other Ambulatory Visit: Payer: Self-pay | Admitting: Family Medicine

## 2017-01-28 ENCOUNTER — Ambulatory Visit
Admission: RE | Admit: 2017-01-28 | Discharge: 2017-01-28 | Disposition: A | Discharge status: Home / Self Care | Payer: 59 | Source: Ambulatory Visit | Attending: Family Medicine | Admitting: Family Medicine

## 2017-01-28 DIAGNOSIS — N6341 Unspecified lump in right breast, subareolar: Secondary | ICD-10-CM | POA: Diagnosis not present

## 2017-01-28 DIAGNOSIS — N6310 Unspecified lump in the right breast, unspecified quadrant: Secondary | ICD-10-CM

## 2017-01-28 DIAGNOSIS — D241 Benign neoplasm of right breast: Secondary | ICD-10-CM | POA: Diagnosis not present

## 2017-08-19 ENCOUNTER — Other Ambulatory Visit: Payer: Self-pay | Admitting: Family Medicine

## 2017-08-19 DIAGNOSIS — Z1231 Encounter for screening mammogram for malignant neoplasm of breast: Secondary | ICD-10-CM

## 2017-08-21 DIAGNOSIS — B349 Viral infection, unspecified: Secondary | ICD-10-CM | POA: Diagnosis not present

## 2017-08-21 DIAGNOSIS — R05 Cough: Secondary | ICD-10-CM | POA: Diagnosis not present

## 2017-08-25 DIAGNOSIS — J209 Acute bronchitis, unspecified: Secondary | ICD-10-CM | POA: Diagnosis not present

## 2017-09-17 ENCOUNTER — Ambulatory Visit
Admission: RE | Admit: 2017-09-17 | Discharge: 2017-09-17 | Disposition: A | Discharge status: Home / Self Care | Payer: 59 | Source: Ambulatory Visit | Attending: Family Medicine | Admitting: Family Medicine

## 2017-09-17 DIAGNOSIS — Z1231 Encounter for screening mammogram for malignant neoplasm of breast: Secondary | ICD-10-CM

## 2018-01-16 DIAGNOSIS — R0981 Nasal congestion: Secondary | ICD-10-CM | POA: Diagnosis not present

## 2018-01-16 DIAGNOSIS — Z23 Encounter for immunization: Secondary | ICD-10-CM | POA: Diagnosis not present

## 2018-01-16 DIAGNOSIS — R51 Headache: Secondary | ICD-10-CM | POA: Diagnosis not present

## 2018-01-17 DIAGNOSIS — Z23 Encounter for immunization: Secondary | ICD-10-CM | POA: Diagnosis not present

## 2018-03-18 DIAGNOSIS — Z23 Encounter for immunization: Secondary | ICD-10-CM | POA: Diagnosis not present

## 2018-03-18 DIAGNOSIS — Z1321 Encounter for screening for nutritional disorder: Secondary | ICD-10-CM | POA: Diagnosis not present

## 2018-03-18 DIAGNOSIS — R109 Unspecified abdominal pain: Secondary | ICD-10-CM | POA: Diagnosis not present

## 2018-03-31 ENCOUNTER — Ambulatory Visit
Admission: RE | Admit: 2018-03-31 | Discharge: 2018-03-31 | Disposition: A | Discharge status: Home / Self Care | Payer: 59 | Source: Ambulatory Visit | Attending: Family Medicine | Admitting: Family Medicine

## 2018-03-31 ENCOUNTER — Other Ambulatory Visit: Payer: Self-pay | Admitting: Family Medicine

## 2018-03-31 DIAGNOSIS — R079 Chest pain, unspecified: Secondary | ICD-10-CM

## 2018-03-31 DIAGNOSIS — R0789 Other chest pain: Secondary | ICD-10-CM | POA: Diagnosis not present

## 2018-03-31 DIAGNOSIS — R1011 Right upper quadrant pain: Secondary | ICD-10-CM | POA: Diagnosis not present

## 2018-04-04 DIAGNOSIS — Z0000 Encounter for general adult medical examination without abnormal findings: Secondary | ICD-10-CM | POA: Diagnosis not present

## 2018-04-06 ENCOUNTER — Other Ambulatory Visit: Payer: Self-pay | Admitting: Family Medicine

## 2018-04-06 DIAGNOSIS — R1011 Right upper quadrant pain: Secondary | ICD-10-CM

## 2018-04-10 ENCOUNTER — Ambulatory Visit
Admission: RE | Admit: 2018-04-10 | Discharge: 2018-04-10 | Disposition: A | Discharge status: Home / Self Care | Payer: 59 | Source: Ambulatory Visit | Attending: Family Medicine | Admitting: Family Medicine

## 2018-04-10 DIAGNOSIS — R1011 Right upper quadrant pain: Secondary | ICD-10-CM

## 2018-06-18 IMAGING — MG MM CLIP PLACEMENT
2 series · 2 of 2 positions shown · non-contrast
Comparison: Previous exam(s).

CLINICAL DATA: Confirmation clip placement after ultrasound-guided
core needle biopsy of an indeterminate mass in the lower inner
subareolar right breast at the 4 o'clock location.

EXAM:
DIAGNOSTIC RIGHT MAMMOGRAM POST ULTRASOUND BIOPSY

[R CC]
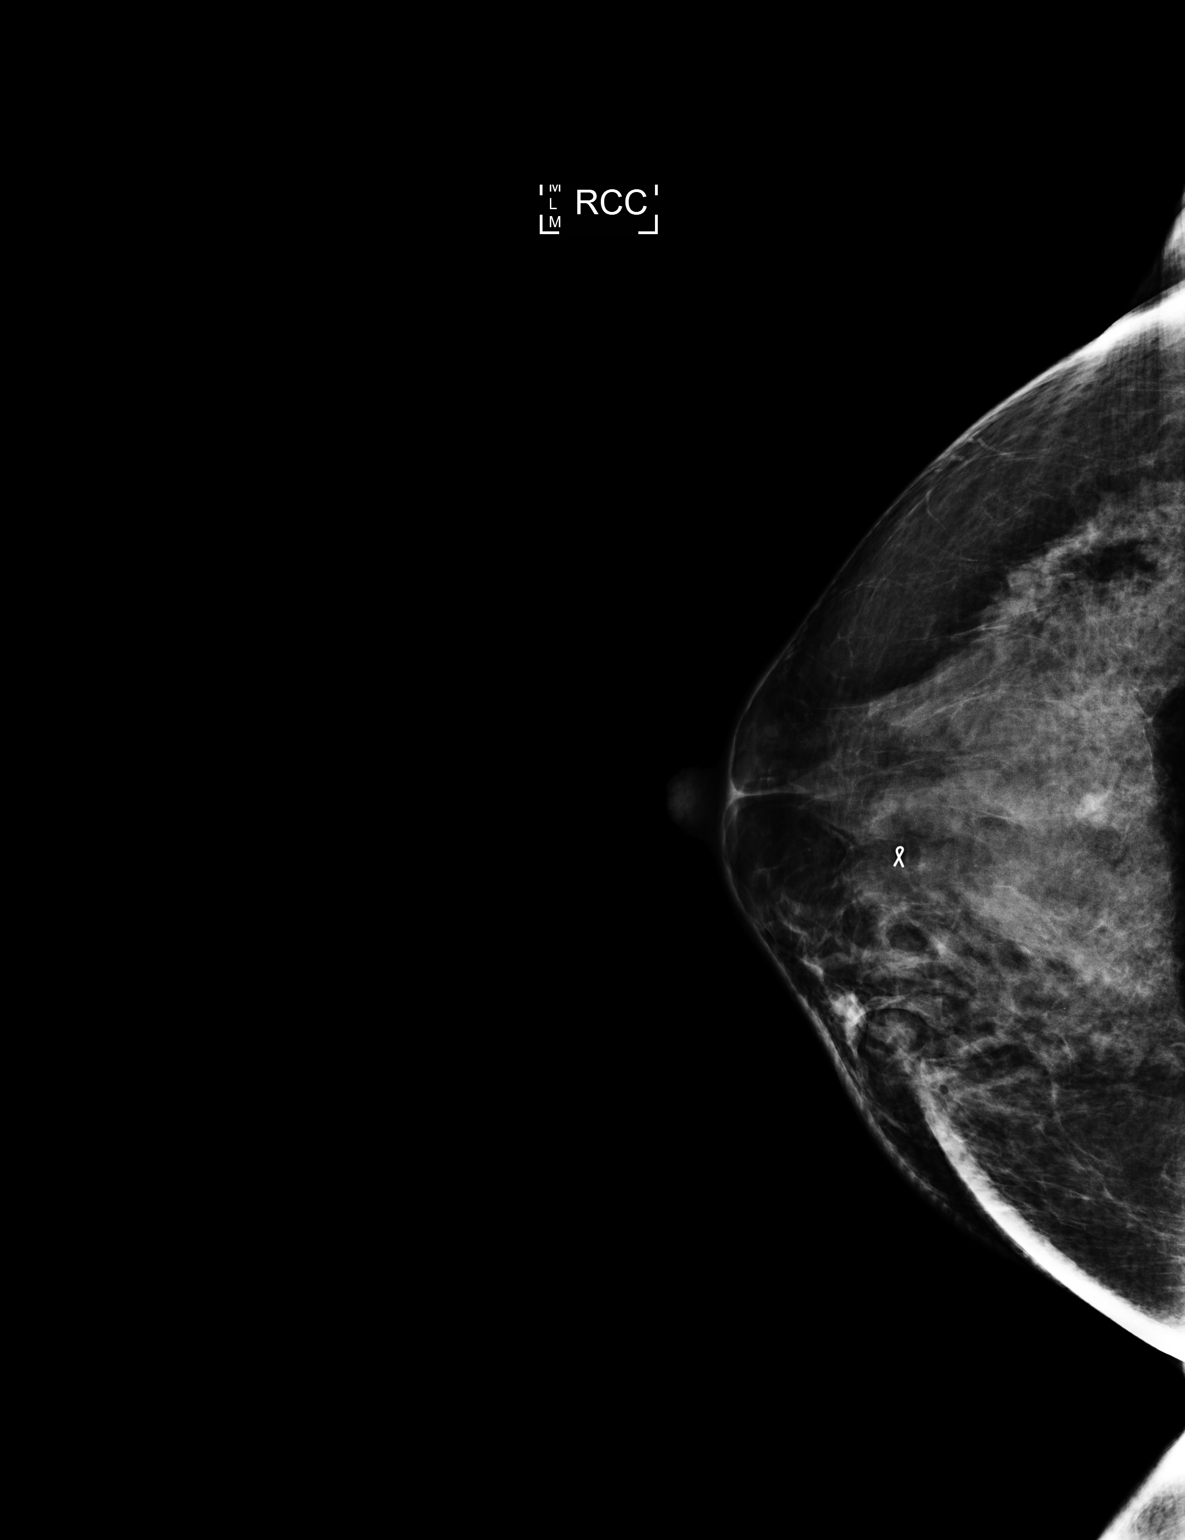

[R ML]
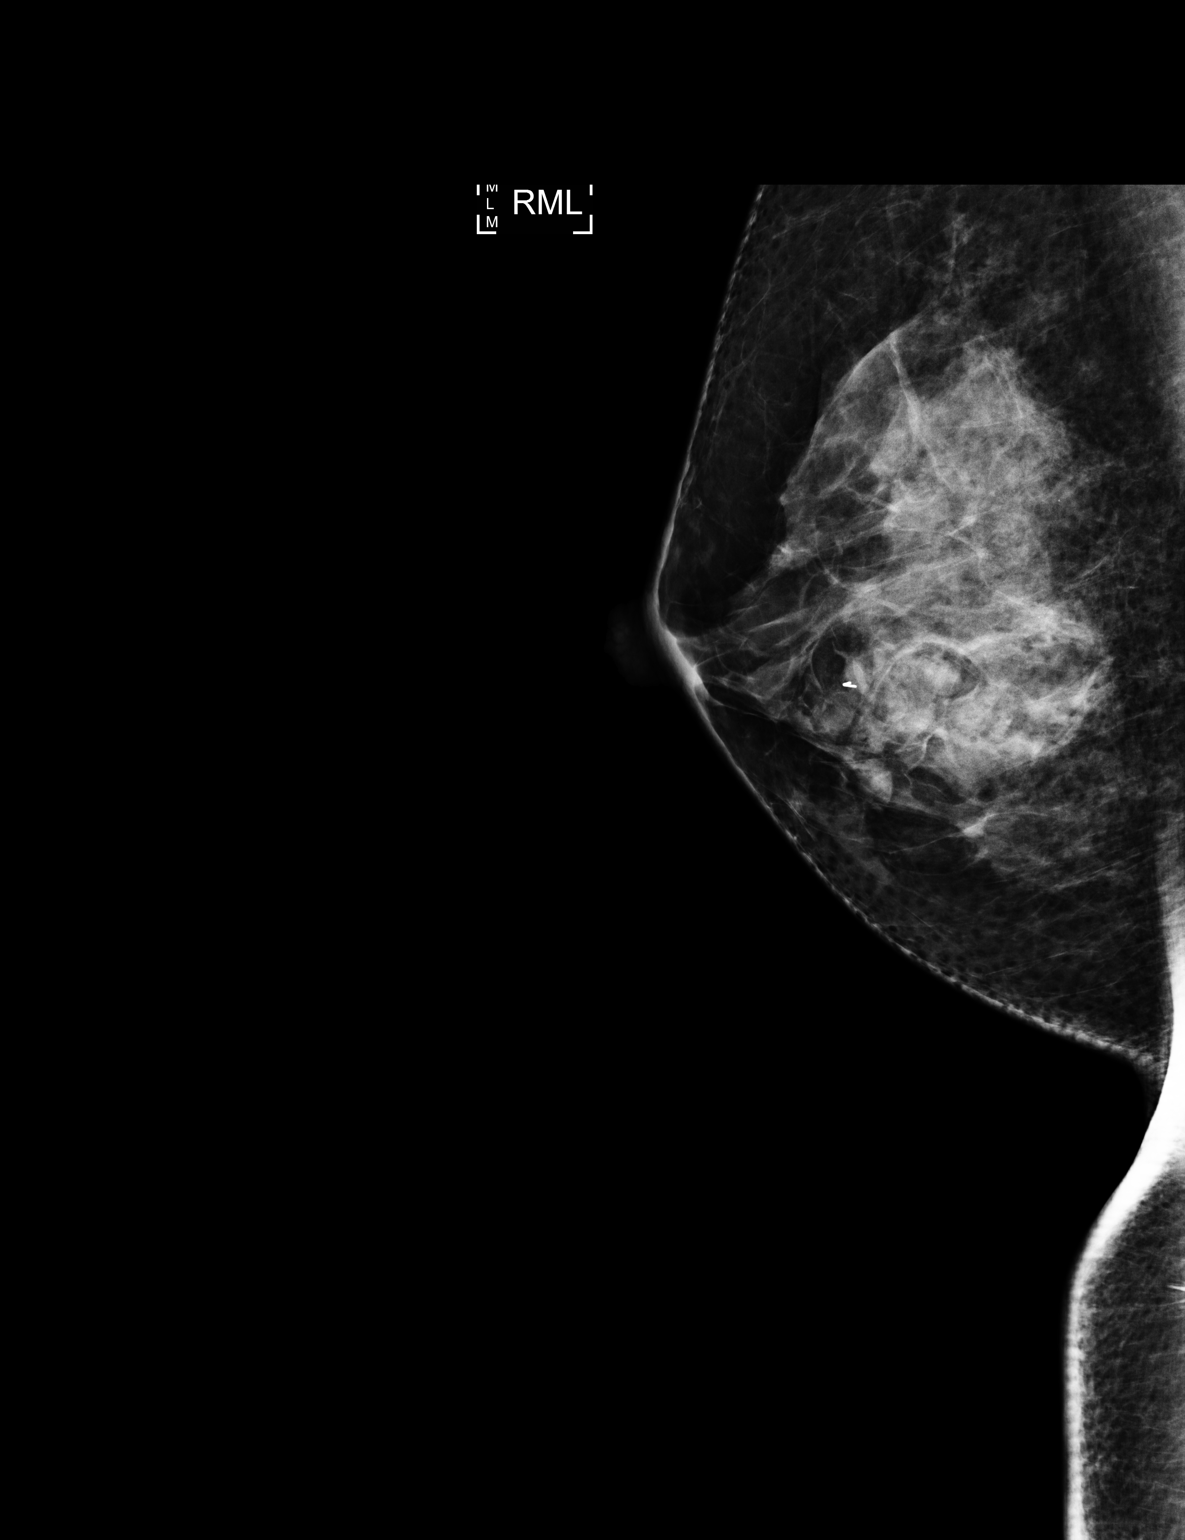

[2 of 2 positions shown; findings below may reference images not displayed]

FINDINGS: Mammographic images were obtained following ultrasound guided biopsy
of a mass in the 4 o'clock subareolar right breast. The ribbon
shaped tissue marker clip is appropriately positioned within the
biopsied mass in the lower inner subareolar location.

Expected post biopsy changes are present without evidence of
hematoma.
IMPRESSION: Appropriate positioning of the ribbon shaped tissue marker clip
within the biopsied mass in the lower inner subareolar right breast.

Final Assessment: Post Procedure Mammograms for Marker Placement
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
## 2018-10-14 ENCOUNTER — Other Ambulatory Visit: Payer: Self-pay | Admitting: Family Medicine

## 2018-10-14 DIAGNOSIS — Z1231 Encounter for screening mammogram for malignant neoplasm of breast: Secondary | ICD-10-CM

## 2018-11-12 ENCOUNTER — Other Ambulatory Visit: Payer: Self-pay

## 2018-11-12 ENCOUNTER — Ambulatory Visit (INDEPENDENT_AMBULATORY_CARE_PROVIDER_SITE_OTHER): Payer: 59 | Admitting: Family Medicine

## 2018-11-12 ENCOUNTER — Encounter: Payer: Self-pay | Admitting: Family Medicine

## 2018-11-12 ENCOUNTER — Telehealth: Payer: Self-pay

## 2018-11-12 DIAGNOSIS — M25561 Pain in right knee: Secondary | ICD-10-CM | POA: Diagnosis not present

## 2018-11-12 DIAGNOSIS — G8929 Other chronic pain: Secondary | ICD-10-CM

## 2018-11-12 NOTE — Progress Notes (Signed)
Noted  
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
## 2018-11-12 NOTE — Telephone Encounter (Signed)
Submitted VOB for Durolane, right knee. 

## 2018-11-13 ENCOUNTER — Encounter: Payer: Self-pay | Admitting: Radiology

## 2018-11-13 NOTE — Progress Notes (Signed)
VOB for Durolane done, patient is covered at 80% for J7318, and 100% 20610/20611.  Deductible must be met first, $450 deductible, $0 met at this time.  $35 copay applies.  No PA needed.    Per previous chart note, patient is to call back if interested in proceeding with Durolane injection.   FYI to you all, thanks.

## 2018-11-19 ENCOUNTER — Telehealth: Payer: Self-pay | Admitting: Family Medicine

## 2018-11-19 ENCOUNTER — Ambulatory Visit
Admission: RE | Admit: 2018-11-19 | Discharge: 2018-11-19 | Disposition: A | Discharge status: Home / Self Care | Payer: 59 | Source: Ambulatory Visit | Attending: Family Medicine | Admitting: Family Medicine

## 2018-11-19 ENCOUNTER — Telehealth: Payer: Self-pay

## 2018-11-19 DIAGNOSIS — Z1231 Encounter for screening mammogram for malignant neoplasm of breast: Secondary | ICD-10-CM

## 2018-11-19 NOTE — Telephone Encounter (Signed)
Patient called and would like to know the status of her PA for her medication. Please call to discuss. [phone]

## 2018-11-19 NOTE — Telephone Encounter (Signed)
Patient called and stated that still having pain in knee after inj. What should she do from this point? Wants to know the status on ins auth for gel injection.  Please call patient to advise. [phone]

## 2018-11-19 NOTE — Telephone Encounter (Signed)
I have already spoken with this patient. See other message on this from today.

## 2018-11-19 NOTE — Telephone Encounter (Signed)
The patient has been approved for the Durolane injection. How soon can she have this done? She had the cortisone injection at the end of last week. The knee was feeling ok until yesterday. Please advise.

## 2018-11-19 NOTE — Telephone Encounter (Signed)
Next week would be fine, if she is still hurting.  [place]

## 2018-11-20 NOTE — Telephone Encounter (Signed)
Spoke with the patient. Appointment scheduled for 6/30 at 4:20.

## 2018-11-24 ENCOUNTER — Ambulatory Visit (INDEPENDENT_AMBULATORY_CARE_PROVIDER_SITE_OTHER): Payer: 59 | Admitting: Family Medicine

## 2018-11-24 ENCOUNTER — Encounter: Payer: Self-pay | Admitting: Family Medicine

## 2018-11-24 ENCOUNTER — Other Ambulatory Visit: Payer: Self-pay

## 2018-11-24 DIAGNOSIS — G8929 Other chronic pain: Secondary | ICD-10-CM | POA: Diagnosis not present

## 2018-11-24 DIAGNOSIS — M1711 Unilateral primary osteoarthritis, right knee: Secondary | ICD-10-CM

## 2018-11-24 NOTE — Progress Notes (Signed)
Subjective: She is here for planned Durolane injection for her right knee.  Objective: Trace effusion, no warmth or erythema.  Procedure: Ultrasound-guided right knee duroane injection: After sterile prep with Betadine, injected 5 cc 1% lidocaine without epinephrine and durolane into the suprapatellar joint recess using ultrasound to guide needle placement to confirm intra-articular placement.  Follow-up as needed.

## 2019-04-14 ENCOUNTER — Other Ambulatory Visit: Payer: Self-pay

## 2019-04-14 ENCOUNTER — Other Ambulatory Visit: Payer: Self-pay | Admitting: Family Medicine

## 2019-04-14 ENCOUNTER — Ambulatory Visit: Payer: Self-pay

## 2019-04-14 DIAGNOSIS — M79671 Pain in right foot: Secondary | ICD-10-CM

## 2019-09-15 ENCOUNTER — Ambulatory Visit
Admission: EM | Admit: 2019-09-15 | Discharge: 2019-09-15 | Disposition: A | Discharge status: Home / Self Care | Payer: 59 | Attending: Physician Assistant | Admitting: Physician Assistant

## 2019-09-15 DIAGNOSIS — R059 Cough, unspecified: Secondary | ICD-10-CM

## 2019-09-15 DIAGNOSIS — R0981 Nasal congestion: Secondary | ICD-10-CM | POA: Diagnosis not present

## 2019-09-15 DIAGNOSIS — R05 Cough: Secondary | ICD-10-CM

## 2019-09-15 DIAGNOSIS — R0982 Postnasal drip: Secondary | ICD-10-CM

## 2019-09-15 MED ORDER — FLUTICASONE PROPIONATE 50 MCG/ACT NA SUSP: 2.0000 | Freq: Every day | NASAL | 0 refills | Status: DC

## 2019-09-15 MED ORDER — AZELASTINE HCL 0.1 % NA SOLN: 2.0000 | Freq: Two times a day (BID) | NASAL | 0 refills | Status: DC

## 2019-09-15 NOTE — ED Triage Notes (Signed)
Pt c/o sore throat and congestion for the  Past month. States sore throat worse in morning with nasal congestion.

## 2019-09-15 NOTE — ED Provider Notes (Signed)
[facility]    CSN: [id number] Arrival date & time: 09/15/19  0940      History   Chief Complaint Chief Complaint  Patient presents with   Sore Throat    HPI May Stephy Mccoppin is a 49 y.o. female.   49 year old female comes in for few month history of cough, rhinorrhea, nasal congestion, post nasal drip.  States saw her primary care 3 months ago for same, and was started on Singulair.  This at first had good relief, but now with worsening symptoms.  States she wakes up in the morning with significant cough and production, but resolves throughout the day.  Denies fever, chills, body aches.  Denies abdominal pain, nausea, vomiting, diarrhea.  Denies shortness of breath, loss of taste or smell.  Never smoker.  States has been told in the past she has chronic bronchitis, and therefore came in for evaluation.     Past Medical History:  Diagnosis Date   Allergy     Patient Active Problem List   Diagnosis Date Noted   Acute appendicitis 03/16/2015    Past Surgical History:  Procedure Laterality Date   BREAST BIOPSY Right 2018   fibroadenoma   LAPAROSCOPIC APPENDECTOMY N/A 03/17/2015   Procedure: APPENDECTOMY LAPAROSCOPIC;  Surgeon: Coralie Keens, MD;  Location: [place];  Service: General;  Laterality: N/A;    OB History   No obstetric history on file.      Home Medications    Prior to Admission medications   Medication Sig Start Date End Date Taking? Authorizing Provider  Ascorbic Acid (VITAMIN C) 500 MG CHEW Chew 1 tablet by mouth daily.    [provider]  azelastine (ASTELIN) 0.1 % nasal spray Place 2 sprays into both nostrils 2 (two) times daily. 09/15/19   Tasia Catchings, Derrian Poli V, PA-C  Cyanocobalamin (VITAMIN B12 PO) Take 1 tablet by mouth daily.    [provider]  fluticasone (FLONASE) 50 MCG/ACT nasal spray Place 2 sprays into both nostrils daily. 09/15/19   Ok Edwards, PA-C  Levocetirizine Dihydrochloride (XYZAL PO) Take by mouth.     [provider]  montelukast (SINGULAIR) 10 MG tablet Take 10 mg by mouth at bedtime.    [provider]  Multiple Vitamins-Minerals (HM MULTIVITAMIN ADULT GUMMY PO) Take 1 tablet by mouth daily.    [provider]    Family History Family History  Problem Relation Age of Onset   Breast cancer Mother     Social History Social History   Tobacco Use   Smoking status: Never Smoker  Substance Use Topics   Alcohol use: No   Drug use: No     Allergies   Patient has no known allergies.   Review of Systems Review of Systems  Reason unable to perform ROS: See HPI as above.     Physical Exam Triage Vital Signs ED Triage Vitals  Enc Vitals Group     BP      Pulse      Resp      Temp      Temp src      SpO2      Weight      Height      Head Circumference      Peak Flow      Pain Score      Pain Loc      Pain Edu?      Excl. in [place]?    No data found.  Updated Vital Signs BP 131/79 (BP Location: Left Arm)    Pulse 79    Temp 98.1 F (36.7 C) (Oral)    Resp 16    SpO2 95%   Physical Exam Constitutional:      General: She is not in acute distress.    Appearance: Normal appearance. She is not ill-appearing, toxic-appearing or diaphoretic.  HENT:     Head: Normocephalic and atraumatic.     Right Ear: Tympanic membrane, ear canal and external ear normal. Tympanic membrane is not erythematous or bulging.     Left Ear: Tympanic membrane, ear canal and external ear normal. Tympanic membrane is not erythematous or bulging.     Nose:     Right Sinus: No maxillary sinus tenderness or frontal sinus tenderness.     Left Sinus: No maxillary sinus tenderness or frontal sinus tenderness.     Mouth/Throat:     Mouth: Mucous membranes are moist.     Pharynx: Oropharynx is clear. Uvula midline.  Cardiovascular:     Rate and Rhythm: Normal rate and regular rhythm.     Heart sounds: Normal heart sounds. No murmur. No friction rub. No gallop.     Pulmonary:     Effort: Pulmonary effort is normal. No accessory muscle usage, prolonged expiration, respiratory distress or retractions.     Comments: Lungs clear to auscultation without adventitious lung sounds. Musculoskeletal:     Cervical back: Normal range of motion and neck supple.  Neurological:     General: No focal deficit present.     Mental Status: She is alert and oriented to person, place, and time.      UC Treatments / Results  Labs (all labs ordered are listed, but only abnormal results are displayed) Labs Reviewed - No data to display  EKG   Radiology No results found.  Procedures Procedures (including critical care time)  Medications Ordered in UC Medications - No data to display  Initial Impression / Assessment and Plan / UC Course  I have reviewed the triage vital signs and the nursing notes.  Pertinent labs & imaging results that were available during my care of the patient were reviewed by me and considered in my medical decision making (see chart for details).    Exam reassuring.  Discussed lungs clear to auscultation bilaterally without adventitious lung sounds, no need for chest x-ray at this time.  No signs of bacterial sinusitis, will defer antibiotics at this time.  Patient with improvement on Xyzal and Singulair, will add on Flonase and azelastine as needed.  Return precautions given.  Patient expresses understanding and agrees to plan.  Final Clinical Impressions(s) / UC Diagnoses   Final diagnoses:  Nasal congestion  Post-nasal drip  Cough   ED Prescriptions    Medication Sig Dispense Auth. Provider   fluticasone (FLONASE) 50 MCG/ACT nasal spray Place 2 sprays into both nostrils daily. 1 g Cipriana Biller V, PA-C   azelastine (ASTELIN) 0.1 % nasal spray Place 2 sprays into both nostrils 2 (two) times daily. 30 mL Ok Edwards, PA-C     PDMP not reviewed this encounter.   Ok Edwards, PA-C 09/15/19 1007

## 2019-09-15 NOTE — Discharge Instructions (Signed)
Continue zyrtec or xyzal in the morning, switch and take montelukast (singulair) to night time. Start flonase as directed. If symptoms not improving, can add on azelastine. Keep hydrated, urine should be clear to pale yellow in color. Follow up with PCP if symptoms still not improving.

## 2019-10-11 ENCOUNTER — Other Ambulatory Visit: Payer: Self-pay | Admitting: Family Medicine

## 2019-10-11 DIAGNOSIS — Z1231 Encounter for screening mammogram for malignant neoplasm of breast: Secondary | ICD-10-CM

## 2019-11-23 ENCOUNTER — Ambulatory Visit
Admission: RE | Admit: 2019-11-23 | Discharge: 2019-11-23 | Disposition: A | Discharge status: Home / Self Care | Payer: 59 | Source: Ambulatory Visit | Attending: Family Medicine | Admitting: Family Medicine

## 2019-11-23 ENCOUNTER — Other Ambulatory Visit: Payer: Self-pay

## 2019-11-23 DIAGNOSIS — Z1231 Encounter for screening mammogram for malignant neoplasm of breast: Secondary | ICD-10-CM

## 2020-06-28 ENCOUNTER — Other Ambulatory Visit: Payer: Self-pay

## 2020-06-28 ENCOUNTER — Telehealth: Payer: Self-pay

## 2020-06-28 ENCOUNTER — Encounter: Payer: Self-pay | Admitting: Family Medicine

## 2020-06-28 ENCOUNTER — Ambulatory Visit (INDEPENDENT_AMBULATORY_CARE_PROVIDER_SITE_OTHER): Payer: 59 | Admitting: Family Medicine

## 2020-06-28 DIAGNOSIS — G8929 Other chronic pain: Secondary | ICD-10-CM

## 2020-06-28 DIAGNOSIS — M25561 Pain in right knee: Secondary | ICD-10-CM

## 2020-06-28 NOTE — Progress Notes (Signed)
   Office Visit Note   Patient: Tara Mullins           Date of Birth: [birth year]           MRN: [id number] Visit Date: 06/28/2020 Requested by: Jonathon Jordan, MD [address],  [place] [address] PCP: Jonathon Jordan, MD  Subjective: Chief Complaint  Patient presents with  . Right Knee - Pain    Pain returned 2 weeks ago. Numbness and pain in the knee. S/p cortisone injection and then Durolane injection in June of 2020.     HPI: She is here with recurrent right knee pain.  Prior good results with Durolane.  She had good relief until just recently.              ROS:   All other systems were reviewed and are negative.  Objective: Vital Signs: There were no vitals taken for this visit.  Physical Exam:  General:  Alert and oriented, in no acute distress. Pulm:  Breathing unlabored. Psy:  Normal mood, congruent affect  Right knee: No significant effusion today, no warmth or erythema.  Good range of motion.  Tender over the lateral patellofemoral joint.    Imaging: No results found.  Assessment & Plan: 1.  Right knee DJD - Will request approval for another injection.  Meanwhile, injected with 6 cc 0.25% bupivacaine and 4 cc 50% dextrose.      Procedures: Right knee dextrose injection: After sterile prep with Betadine, injected cc 0.25% bupivacaine and 4 cc 50% dextrose from lateral midpatellar approach.       PMFS History: Patient Active Problem List   Diagnosis Date Noted  . Acute appendicitis 03/16/2015   Past Medical History:  Diagnosis Date  . Allergy     Family History  Problem Relation Age of Onset  . Breast cancer Mother     Past Surgical History:  Procedure Laterality Date  . BREAST BIOPSY Right 2018   fibroadenoma  . LAPAROSCOPIC APPENDECTOMY N/A 03/17/2015   Procedure: APPENDECTOMY LAPAROSCOPIC;  Surgeon: Coralie Keens, MD;  Location: [facility];  Service: General;  Laterality: N/A;   Social History    Occupational History  . Not on file  Tobacco Use  . Smoking status: Never Smoker  . Smokeless tobacco: Not on file  Substance and Sexual Activity  . Alcohol use: No  . Drug use: No  . Sexual activity: Not on file

## 2020-06-28 NOTE — Telephone Encounter (Signed)
Noted  

## 2020-06-28 NOTE — Telephone Encounter (Signed)
Please submit prior authorization request for visco supplementation for right knee OA, per Dr. Junius Roads.

## 2020-07-07 ENCOUNTER — Telehealth: Payer: Self-pay

## 2020-07-07 NOTE — Telephone Encounter (Signed)
Submitted VOB for Durolane, right knee. Pending BV. 

## 2020-07-11 ENCOUNTER — Telehealth: Payer: Self-pay

## 2020-07-11 NOTE — Telephone Encounter (Signed)
Talked with patient about scheduling an appointment for gel injection.  Per patient she will call back to schedule when she has her calendar.  Approved, Durolane, right knee. Buy & Bill Must meet deductible first Patient will be responsible for 20% OOP. Co-pay of $35.00 No PA required

## 2020-07-19 ENCOUNTER — Ambulatory Visit: Payer: 59 | Admitting: Family Medicine

## 2020-07-19 ENCOUNTER — Other Ambulatory Visit: Payer: Self-pay

## 2020-07-19 DIAGNOSIS — M1711 Unilateral primary osteoarthritis, right knee: Secondary | ICD-10-CM | POA: Diagnosis not present

## 2020-07-19 NOTE — Progress Notes (Signed)
   Office Visit Note   Patient: Tara Mullins           Date of Birth: 19-Dec-1970           MRN: [id number] Visit Date: 07/19/2020 Requested by: Jonathon Jordan, MD [address],  [place] [address] PCP: Jonathon Jordan, MD  Subjective: Chief Complaint  Patient presents with  . Right Knee - Pain, Follow-up    Durolane injection    HPI: She is here for planned Durolane injection for right knee osteoarthritis.                ROS:   All other systems were reviewed and are negative.  Objective: Vital Signs: There were no vitals taken for this visit.  Physical Exam:  General:  Alert and oriented, in no acute distress. Pulm:  Breathing unlabored. Psy:  Normal mood, congruent affect. Skin: No erythema or warmth Right knee: Trace effusion, full range of motion.  Imaging: No results found.  Assessment & Plan: 1.  Right knee osteoarthritis -Durolane injected today.  Follow-up as needed.     Procedures: Right knee Durolane injection: After sterile prep with Betadine, injected 3 cc 0.25% bupivacaine and Durolane from lateral midpatellar approach.       PMFS History: Patient Active Problem List   Diagnosis Date Noted  . Acute appendicitis 03/16/2015   Past Medical History:  Diagnosis Date  . Allergy     Family History  Problem Relation Age of Onset  . Breast cancer Mother     Past Surgical History:  Procedure Laterality Date  . BREAST BIOPSY Right 2018   fibroadenoma  . LAPAROSCOPIC APPENDECTOMY N/A 03/17/2015   Procedure: APPENDECTOMY LAPAROSCOPIC;  Surgeon: Coralie Keens, MD;  Location: [facility];  Service: General;  Laterality: N/A;   Social History   Occupational History  . Not on file  Tobacco Use  . Smoking status: Never Smoker  . Smokeless tobacco: Not on file  Substance and Sexual Activity  . Alcohol use: No  . Drug use: No  . Sexual activity: Not on file

## 2020-07-27 ENCOUNTER — Other Ambulatory Visit: Payer: Self-pay | Admitting: Family Medicine

## 2020-07-27 DIAGNOSIS — Z1231 Encounter for screening mammogram for malignant neoplasm of breast: Secondary | ICD-10-CM

## 2020-11-14 ENCOUNTER — Ambulatory Visit
Admission: EM | Admit: 2020-11-14 | Discharge: 2020-11-14 | Disposition: A | Discharge status: Home / Self Care | Payer: 59 | Attending: Emergency Medicine | Admitting: Emergency Medicine

## 2020-11-14 ENCOUNTER — Other Ambulatory Visit: Payer: Self-pay

## 2020-11-14 DIAGNOSIS — M1711 Unilateral primary osteoarthritis, right knee: Secondary | ICD-10-CM

## 2020-11-14 DIAGNOSIS — I10 Essential (primary) hypertension: Secondary | ICD-10-CM | POA: Diagnosis not present

## 2020-11-14 DIAGNOSIS — J42 Unspecified chronic bronchitis: Secondary | ICD-10-CM | POA: Diagnosis not present

## 2020-11-14 DIAGNOSIS — R519 Headache, unspecified: Secondary | ICD-10-CM

## 2020-11-14 MED ORDER — AEROCHAMBER PLUS FLO-VU MEDIUM MISC
1.0000 | Freq: Once | Status: AC
  Administered 2020-11-14: 1

## 2020-11-14 MED ORDER — ALBUTEROL SULFATE HFA 108 (90 BASE) MCG/ACT IN AERS
2.0000 | INHALATION_SPRAY | Freq: Once | RESPIRATORY_TRACT | Status: AC
  Administered 2020-11-14: 2 via RESPIRATORY_TRACT

## 2020-11-14 MED ORDER — AMLODIPINE BESYLATE 5 MG PO TABS: 5.0000 mg | ORAL_TABLET | Freq: Every day | ORAL | 0 refills | Status: DC

## 2020-11-14 MED ORDER — PREDNISONE 20 MG PO TABS: 40.0000 mg | ORAL_TABLET | Freq: Every day | ORAL | 0 refills | Status: AC

## 2020-11-14 MED ORDER — NAPROXEN 500 MG PO TABS: 500.0000 mg | ORAL_TABLET | Freq: Two times a day (BID) | ORAL | 0 refills | Status: DC

## 2020-11-14 NOTE — Discharge Instructions (Addendum)
Begin prednisone daily x 5 days to help with wheezing/inflammation in lungs as well as knee pain Albuterol inhaler 1 to 2 puffs every 4-6 hours as needed for shortness of breath chest tightness and wheezing After completing prednisone May use Naprosyn twice daily with food as needed for knee pain and headaches Begin amlodipine daily for blood pressure, continue to monitor blood pressure at home, follow-up with primary care for recheck Please follow-up if any symptoms not improving or worsening

## 2020-11-14 NOTE — ED Triage Notes (Signed)
Pt c/o HTN and headache for almost a year since menopause. States been monitoring b/p's at home and when its high she would drink water and it would help. States never dx'd with HTN.  Pt c/o rt knee pain radiating to rt foot with numbness for over 2 months.

## 2020-11-14 NOTE — ED Provider Notes (Signed)
[facility]    CSN: [id number] Arrival date & time: 11/14/20  1116      History   Chief Complaint Chief Complaint  Patient presents with   Headache    HPI Tara Mullins is a 50 y.o. female presenting today for evaluation of multiple complaints-hypertension, headache and knee pain.  Reports that she has chronic right knee pain from underlying osteoarthritis.  Typically will see if hyaluronic acid injections, last done in February/March 2022.  She reports increased pain of recently radiating into her right lower leg.  Denies new injury or fall.  Denies any swelling.  There is reported numbness sensation.  Does not take any oral medicine for this.  Works on her feet for long periods of time.  She also reports blood pressures been elevated over the past year.  Checked her blood pressure yesterday and was 169/148.  She expresses concern over this as she feels her pressures been elevated over the past year since she has been menopausal.  Reports associated headaches located in the frontal area but wrap around head, sometimes into neck.  Denies vision changes.  Denies nausea or vomiting.  Reports history of chronic bronchitis, wakes up most mornings with a lot of congestion and wheezing.  Denies history of smoking.  HPI  Past Medical History:  Diagnosis Date   Allergy     Patient Active Problem List   Diagnosis Date Noted   Acute appendicitis 03/16/2015    Past Surgical History:  Procedure Laterality Date   BREAST BIOPSY Right 2018   fibroadenoma   LAPAROSCOPIC APPENDECTOMY N/A 03/17/2015   Procedure: APPENDECTOMY LAPAROSCOPIC;  Surgeon: Coralie Keens, MD;  Location: [facility];  Service: General;  Laterality: N/A;    OB History   No obstetric history on file.      Home Medications    Prior to Admission medications   Medication Sig Start Date End Date Taking? Authorizing Provider  amLODipine (NORVASC) 5 MG tablet Take 1 tablet (5 mg total) by mouth  daily. 11/14/20  Yes Axton Cihlar C, PA-C  naproxen (NAPROSYN) 500 MG tablet Take 1 tablet (500 mg total) by mouth 2 (two) times daily. Begin as needed for knee pain/ headaches after completing prednisone course 11/14/20  Yes Omkar Stratmann C, PA-C  predniSONE (DELTASONE) 20 MG tablet Take 2 tablets (40 mg total) by mouth daily with breakfast for 5 days. 11/14/20 11/19/20 Yes Zymier Rodgers C, PA-C  Ascorbic Acid (VITAMIN C) 500 MG CHEW Chew 1 tablet by mouth daily.    [provider]  Cyanocobalamin (VITAMIN B12 PO) Take 1 tablet by mouth daily.    [provider]  montelukast (SINGULAIR) 10 MG tablet Take 10 mg by mouth at bedtime.    [provider]  Multiple Vitamins-Minerals (HM MULTIVITAMIN ADULT GUMMY PO) Take 1 tablet by mouth daily.    [provider]    Family History Family History  Problem Relation Age of Onset   Breast cancer Mother     Social History Social History   Tobacco Use   Smoking status: Never   Smokeless tobacco: Never  Substance Use Topics   Alcohol use: No   Drug use: No     Allergies   Patient has no known allergies.   Review of Systems Review of Systems  Constitutional:  Negative for activity change, appetite change, chills, fatigue and fever.  HENT:  Positive for congestion. Negative for ear pain, rhinorrhea, sinus pressure, sore throat and trouble swallowing.  Eyes:  Negative for discharge and redness.  Respiratory:  Positive for cough and chest tightness. Negative for shortness of breath.   Cardiovascular:  Negative for chest pain.  Gastrointestinal:  Negative for abdominal pain, diarrhea, nausea and vomiting.  Musculoskeletal:  Positive for arthralgias and gait problem. Negative for myalgias.  Skin:  Negative for rash.  Neurological:  Positive for headaches. Negative for dizziness and light-headedness.    Physical Exam Triage Vital Signs ED Triage Vitals  Enc Vitals Group     BP 11/14/20 1307 (!)  163/76     Pulse Rate 11/14/20 1307 74     Resp 11/14/20 1307 18     Temp 11/14/20 1307 99.3 F (37.4 C)     Temp Source 11/14/20 1307 Oral     SpO2 11/14/20 1307 97 %     Weight --      Height --      Head Circumference --      Peak Flow --      Pain Score 11/14/20 1308 8     Pain Loc --      Pain Edu? --      Excl. in [place]? --    No data found.  Updated Vital Signs BP (!) 163/76 (BP Location: Left Arm)   Pulse 74   Temp 99.3 F (37.4 C) (Oral)   Resp 18   LMP 11/01/2019   SpO2 97%   Visual Acuity Right Eye Distance:   Left Eye Distance:   Bilateral Distance:    Right Eye Near:   Left Eye Near:    Bilateral Near:     Physical Exam Vitals and nursing note reviewed.  Constitutional:      Appearance: She is well-developed.     Comments: No acute distress  HENT:     Head: Normocephalic and atraumatic.     Ears:     Comments: Bilateral ears without tenderness to palpation of external auricle, tragus and mastoid, EAC's without erythema or swelling, TM's with good bony landmarks and cone of light. Non erythematous.      Nose: Nose normal.     Mouth/Throat:     Comments: Oral mucosa pink and moist, no tonsillar enlargement or exudate. Posterior pharynx patent and nonerythematous, no uvula deviation or swelling. Normal phonation.  Eyes:     Conjunctiva/sclera: Conjunctivae normal.  Cardiovascular:     Rate and Rhythm: Normal rate and regular rhythm.  Pulmonary:     Effort: Pulmonary effort is normal. No respiratory distress.     Comments: Mild expiratory wheezing throughout bilateral lung fields Abdominal:     General: There is no distension.  Musculoskeletal:        General: Normal range of motion.     Cervical back: Neck supple.     Comments: Right knee no obvious swelling deformity or discoloration, nontender palpation of her patella, mild tenderness to palpation of bilateral joint lines, slight discomfort noted in the popliteal area, nontender throughout, no  overlying erythema warmth or swelling noted into lower leg, full active range of motion with extension and flexion  Skin:    General: Skin is warm and dry.  Neurological:     Mental Status: She is alert and oriented to person, place, and time.     UC Treatments / Results  Labs (all labs ordered are listed, but only abnormal results are displayed) Labs Reviewed - No data to display  EKG   Radiology No results found.  Procedures Procedures (including critical  care time)  Medications Ordered in UC Medications  albuterol (VENTOLIN HFA) 108 (90 Base) MCG/ACT inhaler 2 puff (2 puffs Inhalation Given 11/14/20 1351)  AeroChamber Plus Flo-Vu Medium MISC 1 each (1 each Other Given 11/14/20 1351)    Initial Impression / Assessment and Plan / UC Course  I have reviewed the triage vital signs and the nursing notes.  Pertinent labs & imaging results that were available during my care of the patient were reviewed by me and considered in my medical decision making (see chart for details).     Chronic bronchitis-prednisone, albuterol inhaler Hypertension-reports similar elevations x1 year, initiating on amlodipine Right knee pain-chronic due to osteoarthritis, no signs of DVT at this time, recommend continued anti-inflammatories, follow-up with Ortho as planned next week, providing Naprosyn to use as needed Headache-no neurodeficits, Naprosyn as needed  Discussed strict return precautions. Patient verbalized understanding and is agreeable with plan.  Final Clinical Impressions(s) / UC Diagnoses   Final diagnoses:  Essential hypertension  Acute nonintractable headache, unspecified headache type  Chronic bronchitis, unspecified chronic bronchitis type ([place])  Osteoarthritis of right knee, unspecified osteoarthritis type     Discharge Instructions      Begin prednisone daily x 5 days to help with wheezing/inflammation in lungs as well as knee pain Albuterol inhaler 1 to 2 puffs every  4-6 hours as needed for shortness of breath chest tightness and wheezing After completing prednisone Tara use Naprosyn twice daily with food as needed for knee pain and headaches Begin amlodipine daily for blood pressure, continue to monitor blood pressure at home, follow-up with primary care for recheck Please follow-up if any symptoms not improving or worsening      ED Prescriptions     Medication Sig Dispense Auth. Provider   predniSONE (DELTASONE) 20 MG tablet Take 2 tablets (40 mg total) by mouth daily with breakfast for 5 days. 10 tablet Eraina Winnie C, PA-C   naproxen (NAPROSYN) 500 MG tablet Take 1 tablet (500 mg total) by mouth 2 (two) times daily. Begin as needed for knee pain/ headaches after completing prednisone course 30 tablet Tashona Calk C, PA-C   amLODipine (NORVASC) 5 MG tablet Take 1 tablet (5 mg total) by mouth daily. 60 tablet Wlliam Grosso, [place] C, PA-C      PDMP not reviewed this encounter.   Janith Lima, PA-C 11/14/20 1507

## 2020-11-21 ENCOUNTER — Telehealth: Payer: Self-pay | Admitting: Family Medicine

## 2020-11-21 ENCOUNTER — Encounter: Payer: Self-pay | Admitting: Family Medicine

## 2020-11-21 ENCOUNTER — Other Ambulatory Visit: Payer: Self-pay

## 2020-11-21 ENCOUNTER — Ambulatory Visit (INDEPENDENT_AMBULATORY_CARE_PROVIDER_SITE_OTHER): Payer: 59 | Admitting: Family Medicine

## 2020-11-21 DIAGNOSIS — M1711 Unilateral primary osteoarthritis, right knee: Secondary | ICD-10-CM | POA: Diagnosis not present

## 2020-11-21 NOTE — Progress Notes (Signed)
Office Visit Note   Patient: Tara Mullins           Date of Birth: July 25, 1970           MRN: [id number] Visit Date: 11/21/2020 Requested by: Jonathon Jordan, MD [place],  [place] [id number] PCP: Jonathon Jordan, MD  Subjective: Chief Complaint  Patient presents with   Right Knee - Pain    Painful after she gets home and removes the compression hose and brace, she has numbness and tinging, and painful to extend out.  Went to UC and the rx'd prednisone 10mg , and naprosyn 500mg . Says that she has tingling from the knee to the foot.  States that she had durolane and in Feb.     HPI: 50yo F presenting to clinic with recurrent right knee pain. Patient recently underwent Duralane in Feb, and states that it only offered a few months of relief (previously lasted 2+ years). She says her knee will ache severely at the end of the day, despite adherence with home exercises, bracing, and compression wear. She is curious about getting a knee injection today, and other exercises she can do to improve her symptoms.              Objective: Vital Signs: LMP 11/01/2019   Physical Exam:  General:  Alert and oriented, in no acute distress. Pulm:  Breathing unlabored. Psy:  Normal mood, congruent affect. Skin:  Right knee with no bruising, erythema, or rashes. Overlying skin intact.   Right Knee pain:  General: Normal gait  Seated Exam:  Mild patellar crepitus, Negative J-Sign.  Full ROM in flexion and extension.   Palpation: Endorses tenderness to palpation over medial joint line. No tenderness with palpation of patella, patellar tendon, or lateral joint line. No tenderness over patellar facets.   Supine exam: Trace effusion, normal patellar mobility.   Ligamentous Exam:  No pain or laxity with anterior/posterior drawer.  No obvious Sag.  No pain or laxity with varus/valgus stress across the knee.   Meniscus:  McMurray with no pain or deep clicking.   Thessaly negative.   Strength: Hip flexion (L1), Hip Aduction (L2), Knee Extension (L3) are 5/5 Bilaterally Foot Inversion (L4), Dorsiflexion (L5), and Eversion (S1) 5/5 Bilaterally  Sensation: Intact to light touch medial and lateral aspects of lower extremities, and lateral, dorsal, and medial aspects of foot.    Imaging: No results found.  Assessment & Plan: 50yo F presenting to clinic with recurrence of right knee pain, in setting of known OA. Previous duralane injection has already worn off, and patient requesting trial of steroid injection today. CSI performed as described below, which patient tolerated very well. Aftercare and return precautions discussed. If no improvement in 2-3 weeks, RTC.  - Given previous success of duralane, will submit to insurance for approval once she has reached the 6 mo mark.      Procedures: Right Knee Cortisone Injection:  Risks and benefits of procedure discussed, Patient opted to proceed. Verbal Consent obtained.  Timeout performed.  Skin prepped in a sterile fashion with betadine before further cleansing with alcohol. Ethyl Chloride was used for topical analgesia.  Right knee was injected with 3cc 1% Lidocaine without epinephrine via the medial mid-patellar approach using a 25G, 1.5in needle. Syringe was removed from the needle, and 6mg  betamethadone was then injected into the joint.   Patient tolerated the injection well with no immediate complications. Aftercare instructions were discussed, and patient was given strict  return precautions.     PMFS History: Patient Active Problem List   Diagnosis Date Noted   Acute appendicitis 03/16/2015   Past Medical History:  Diagnosis Date   Allergy     Family History  Problem Relation Age of Onset   Breast cancer Mother     Past Surgical History:  Procedure Laterality Date   BREAST BIOPSY Right 2018   fibroadenoma   LAPAROSCOPIC APPENDECTOMY N/A 03/17/2015   Procedure: APPENDECTOMY  LAPAROSCOPIC;  Surgeon: Coralie Keens, MD;  Location: [facility];  Service: General;  Laterality: N/A;   Social History   Occupational History   Not on file  Tobacco Use   Smoking status: Never   Smokeless tobacco: Never  Substance and Sexual Activity   Alcohol use: No   Drug use: No   Sexual activity: Not on file

## 2020-11-21 NOTE — Progress Notes (Signed)
I saw and examined the patient with Dr. Elouise Munroe and agree with assessment and plan as outlined.    Persistent right knee pain.   Will proceed with steroid injection today.  Durolane in August if needed.

## 2020-11-22 NOTE — Telephone Encounter (Signed)
1 

## 2020-11-23 ENCOUNTER — Other Ambulatory Visit: Payer: Self-pay

## 2020-11-23 ENCOUNTER — Ambulatory Visit
Admission: RE | Admit: 2020-11-23 | Discharge: 2020-11-23 | Disposition: A | Discharge status: Home / Self Care | Payer: 59 | Source: Ambulatory Visit | Attending: Family Medicine | Admitting: Family Medicine

## 2020-11-23 DIAGNOSIS — Z1231 Encounter for screening mammogram for malignant neoplasm of breast: Secondary | ICD-10-CM

## 2020-11-28 ENCOUNTER — Other Ambulatory Visit: Payer: Self-pay | Admitting: Family Medicine

## 2020-11-28 DIAGNOSIS — R928 Other abnormal and inconclusive findings on diagnostic imaging of breast: Secondary | ICD-10-CM

## 2020-12-19 ENCOUNTER — Ambulatory Visit
Admission: RE | Admit: 2020-12-19 | Discharge: 2020-12-19 | Disposition: A | Discharge status: Home / Self Care | Payer: 59 | Source: Ambulatory Visit | Attending: Family Medicine | Admitting: Family Medicine

## 2020-12-19 ENCOUNTER — Other Ambulatory Visit: Payer: Self-pay

## 2020-12-19 DIAGNOSIS — R928 Other abnormal and inconclusive findings on diagnostic imaging of breast: Secondary | ICD-10-CM

## 2021-06-21 ENCOUNTER — Encounter: Payer: Self-pay | Admitting: Emergency Medicine

## 2021-06-21 ENCOUNTER — Other Ambulatory Visit: Payer: Self-pay

## 2021-06-21 ENCOUNTER — Ambulatory Visit
Admission: EM | Admit: 2021-06-21 | Discharge: 2021-06-21 | Disposition: A | Discharge status: Home / Self Care | Payer: 59 | Attending: Emergency Medicine | Admitting: Emergency Medicine

## 2021-06-21 DIAGNOSIS — J309 Allergic rhinitis, unspecified: Secondary | ICD-10-CM

## 2021-06-21 DIAGNOSIS — J4521 Mild intermittent asthma with (acute) exacerbation: Secondary | ICD-10-CM

## 2021-06-21 MED ORDER — CETIRIZINE HCL 10 MG PO TABS: 10.0000 mg | ORAL_TABLET | Freq: Every day | ORAL | 0 refills | Status: DC

## 2021-06-21 MED ORDER — METHYLPREDNISOLONE 4 MG PO TBPK: ORAL_TABLET | ORAL | 0 refills | Status: DC

## 2021-06-21 MED ORDER — FLUTICASONE PROPIONATE 50 MCG/ACT NA SUSP: 2.0000 | Freq: Every day | NASAL | 0 refills | Status: DC

## 2021-06-21 MED ORDER — MONTELUKAST SODIUM 10 MG PO TABS: 10.0000 mg | ORAL_TABLET | Freq: Every day | ORAL | 0 refills | Status: DC

## 2021-06-21 MED ORDER — AEROCHAMBER PLUS FLO-VU LARGE MISC: 1.0000 | Freq: Once | 0 refills | Status: AC

## 2021-06-21 MED ORDER — ALBUTEROL SULFATE HFA 108 (90 BASE) MCG/ACT IN AERS: 2.0000 | INHALATION_SPRAY | Freq: Four times a day (QID) | RESPIRATORY_TRACT | 0 refills | Status: DC | PRN

## 2021-06-21 NOTE — Discharge Instructions (Signed)
Your symptoms and my physical exam findings are concerning for exacerbation of your underlying allergies.  Please begin the following allergy medications as well:   Fluticasone (Flonase): This is a steroid nasal spray that you use once daily, 1 spray in each nare.  After 3 to 5 days of use, you will have significant improvement of the inflammation and mucus production that is being caused by exposure to allergens.  This medication can be purchased over-the-counter however I have provided you with a prescription.      Cetirizine (Zyrtec): This is an excellent second-generation antihistamine that helps to reduce respiratory inflammatory response to viruses and environmental allergens.  Please take 1 tablet daily at bedtime.  It is important that you are consistent with taking allergy medications exactly as prescribed.  Not doing so increases your risk of more frequent upper respiratory infections that may or may not require the use of antibiotics, serious exacerbations that require the use of oral steroids, loss of time at work and missed social opportunities.   Your symptoms and my physical exam findings are concerning for exacerbation of your underlying asthma.  I provided you with a prescription of albuterol and methylprednisolone.  Please take both exactly as prescribed.  Please also continue taking Singulair, 1 tablet every evening at bedtime.    Methylprednisolone (Medrol Dosepak): This is a steroid that will significantly calm your upper and lower airways, please take one row of tablets daily with your breakfast meal starting tomorrow morning until the prescription is complete.      Albuterol HFA: This is a bronchodilator, it relaxes the smooth muscles that constrict your airway in your lungs when you are feeling sick or having inflammation secondary to allergies or upper respiratory infection.  Please inhale 2 puffs twice daily every day using the spacer provided.  You can also inhale 2 more puffs  as often as needed throughout the day for aggravating cough, chest tightness, feeling short of breath, wheezing.      It is important that you are consistent with taking your asthma medications exactly as prescribed.  Not doing so increases your risk of more frequent upper respiratory infections that may or may not require the use of antibiotics, more serious asthma exacerbations that may require the use of stronger oral steroids, also time at work and missed social opportunities.  You also significantly increase your risk of hospitalization secondary to respiratory infections.           Once you are feeling better from this episode, you can decrease and discontinue use of albuterol.  I would like for you to continue taking Flonase, cetirizine and montelukast however because these medications are effective at preventing you from having any future episodes such as this 1.            Please follow-up within the next 3 to 5 days either with your primary care provider or urgent care if your symptoms do not resolve.  If you do not have a primary care provider, we will assist you in finding one.  Thank you for visiting urgent care today.  I appreciate the opportunity to participate in your care.

## 2021-06-21 NOTE — ED Provider Notes (Signed)
Tara Mullins    CSN: [id number] Arrival date & time: 06/21/21  1653    HISTORY   Chief Complaint  Patient presents with   Cough   HPI Tara Mullins is a 51 y.o. female.  Cough Past Medical History:  Diagnosis Date   Allergy    Patient Active Problem List   Diagnosis Date Noted   Acute appendicitis 03/16/2015   Past Surgical History:  Procedure Laterality Date   BREAST BIOPSY Right 2018   fibroadenoma   LAPAROSCOPIC APPENDECTOMY N/A 03/17/2015   Procedure: APPENDECTOMY LAPAROSCOPIC;  Surgeon: Coralie Keens, MD;  Location: [facility];  Service: General;  Laterality: N/A;   OB History   No obstetric history on file.    Home Medications    Prior to Admission medications   Medication Sig Start Date End Date Taking? Authorizing Provider  amLODipine (NORVASC) 5 MG tablet Take 1 tablet (5 mg total) by mouth daily. 11/14/20  Yes Wieters, Hallie C, PA-C  Ascorbic Acid (VITAMIN C) 500 MG CHEW Chew 1 tablet by mouth daily.   Yes [provider]  Cyanocobalamin (VITAMIN B12 PO) Take 1 tablet by mouth daily.   Yes [provider]  montelukast (SINGULAIR) 10 MG tablet Take 10 mg by mouth at bedtime.   Yes [provider]  Multiple Vitamins-Minerals (HM MULTIVITAMIN ADULT GUMMY PO) Take 1 tablet by mouth daily.   Yes [provider]  naproxen (NAPROSYN) 500 MG tablet Take 1 tablet (500 mg total) by mouth 2 (two) times daily. Begin as needed for knee pain/ headaches after completing prednisone course 11/14/20  Yes Wieters, [place] C, PA-C   Family History Family History  Problem Relation Age of Onset   Breast cancer Mother    Social History Social History   Tobacco Use   Smoking status: Never   Smokeless tobacco: Never  Substance Use Topics   Alcohol use: No   Drug use: No   Allergies   Patient has no known allergies.  Review of Systems Review of Systems  Respiratory:  Positive for cough.   Pertinent  findings noted in history of present illness.   Physical Exam Triage Vital Signs ED Triage Vitals  Enc Vitals Group     BP 03/23/21 0827 (!) 147/82     Pulse Rate 03/23/21 0827 72     Resp 03/23/21 0827 18     Temp 03/23/21 0827 98.3 F (36.8 C)     Temp Source 03/23/21 0827 Oral     SpO2 03/23/21 0827 98 %     Weight --      Height --      Head Circumference --      Peak Flow --      Pain Score 03/23/21 0826 5     Pain Loc --      Pain Edu? --      Excl. in [place]? --   No data found.  Updated Vital Signs BP (!) 159/79 (BP Location: Right Arm)    Pulse 73    Temp 98.6 F (37 C) (Oral)    Resp 20    Ht 4\' 10"  (1.473 m)    Wt 100 lb 1.4 oz (45.4 kg)    LMP 11/01/2019    SpO2 95%    BMI 20.92 kg/m   Physical Exam Vitals and nursing note reviewed.  Constitutional:      General: She is not in acute distress.    Appearance: Normal appearance. She is  not ill-appearing.  HENT:     Head: Normocephalic and atraumatic.     Salivary Glands: Right salivary gland is not diffusely enlarged or tender. Left salivary gland is not diffusely enlarged or tender.     Right Ear: Hearing, ear canal and external ear normal. No drainage. A middle ear effusion is present. There is no impacted cerumen. Tympanic membrane is bulging. Tympanic membrane is not injected or erythematous.     Left Ear: Hearing, ear canal and external ear normal. No drainage. A middle ear effusion is present. There is no impacted cerumen. Tympanic membrane is bulging. Tympanic membrane is not injected or erythematous.     Ears:     Comments: Bilateral EACs normal, both TMs bulging with clear fluid    Nose: Rhinorrhea present. No nasal deformity, septal deviation, signs of injury, nasal tenderness, mucosal edema or congestion. Rhinorrhea is clear.     Right Nostril: Occlusion present. No foreign body, epistaxis or septal hematoma.     Left Nostril: Occlusion present. No foreign body, epistaxis or septal hematoma.     Right  Turbinates: Enlarged, swollen and pale.     Left Turbinates: Enlarged, swollen and pale.     Right Sinus: No maxillary sinus tenderness or frontal sinus tenderness.     Left Sinus: No maxillary sinus tenderness or frontal sinus tenderness.     Mouth/Throat:     Lips: Pink. No lesions.     Mouth: Mucous membranes are moist. No oral lesions.     Pharynx: Oropharynx is clear. Uvula midline. No posterior oropharyngeal erythema or uvula swelling.     Tonsils: No tonsillar exudate. 0 on the right. 0 on the left.     Comments: Postnasal drip Eyes:     General: Lids are normal.        Right eye: No discharge.        Left eye: No discharge.     Extraocular Movements: Extraocular movements intact.     Conjunctiva/sclera: Conjunctivae normal.     Right eye: Right conjunctiva is not injected.     Left eye: Left conjunctiva is not injected.  Neck:     Trachea: Trachea and phonation normal. No tracheal tenderness.  Cardiovascular:     Rate and Rhythm: Normal rate and regular rhythm.     Pulses: Normal pulses.     Heart sounds: Normal heart sounds. No murmur heard.   No friction rub. No gallop.  Pulmonary:     Effort: Pulmonary effort is normal. No accessory muscle usage, prolonged expiration or respiratory distress.     Breath sounds: No stridor, decreased air movement or transmitted upper airway sounds. Examination of the right-lower field reveals wheezing. Examination of the left-lower field reveals wheezing. Wheezing present. No decreased breath sounds, rhonchi or rales.  Chest:     Chest wall: No tenderness.  Abdominal:     General: Abdomen is flat. Bowel sounds are normal.     Palpations: Abdomen is soft.     Tenderness: There is no abdominal tenderness.     Hernia: No hernia is present.  Musculoskeletal:        General: Normal range of motion.     Cervical back: Normal range of motion and neck supple. No edema, erythema, rigidity or crepitus. No pain with movement. Normal range of  motion.     Right lower leg: No edema.     Left lower leg: No edema.  Lymphadenopathy:     Cervical: No cervical adenopathy.  Skin:    General: Skin is warm and dry.     Findings: No erythema or rash.     Comments: Skin is warm and dry to touch, good turgor, no rash appreciated  Neurological:     General: No focal deficit present.     Mental Status: She is alert and oriented to person, place, and time.     Motor: Motor function is intact.     Coordination: Coordination is intact.     Gait: Gait is intact.     Deep Tendon Reflexes:     Reflex Scores:      Patellar reflexes are 2+ on the right side and 2+ on the left side. Psychiatric:        Attention and Perception: Attention and perception normal.        Mood and Affect: Mood normal.        Speech: Speech normal.        Behavior: Behavior normal. Behavior is cooperative.        Thought Content: Thought content normal.        Cognition and Memory: Cognition normal.        Judgment: Judgment normal.    Visual Acuity Right Eye Distance:   Left Eye Distance:   Bilateral Distance:    Right Eye Near:   Left Eye Near:    Bilateral Near:     UC Couse / Diagnostics / Procedures:    EKG  Radiology No results found.  Procedures Procedures (including critical Mullins time)  UC Diagnoses / Final Clinical Impressions(s)   I have reviewed the triage vital signs and the nursing notes.  Pertinent labs & imaging results that were available during my Mullins of the patient were reviewed by me and considered in my medical decision making (see chart for details).   Final diagnoses:  Allergic rhinitis, unspecified seasonality, unspecified trigger  Mild intermittent reactive airway disease with acute exacerbation   Patient is in no acute distress, vital signs are essentially normal except for a very elevated blood pressure.  Patient does have significant wheezing on physical exam today along with significant nasal congestion and postnasal  drip.  Recommend that she continue Singulair, add Zyrtec and Flonase, patient also provided with a Medrol Dosepak and albuterol to use until she is feeling better.  Return precautions advised.  ED Prescriptions     Medication Sig Dispense Auth. Provider   albuterol (VENTOLIN HFA) 108 (90 Base) MCG/ACT inhaler Inhale 2 puffs into the lungs every 6 (six) hours as needed for wheezing or shortness of breath (Cough). 18 g Lynden Oxford Scales, PA-C   methylPREDNISolone (MEDROL DOSEPAK) 4 MG TBPK tablet Take 24 mg on day 1 (all 6 tablets at once), 20 mg on day 2 (all 5 tablets at once), 16 mg on day 3 (all 4 tablets at once) 12 mg on day 4 (all 3 tablets at once), 8 mg on day 5 (both tablets at once), 4 mg on day 6. 21 tablet Lynden Oxford Scales, PA-C   montelukast (SINGULAIR) 10 MG tablet Take 1 tablet (10 mg total) by mouth at bedtime. 90 tablet Lynden Oxford Scales, PA-C   Spacer/Aero-Holding Chambers (AEROCHAMBER PLUS FLO-VU LARGE) MISC 1 each by Other route once for 1 dose. 1 each Lynden Oxford Scales, PA-C   fluticasone (FLONASE) 50 MCG/ACT nasal spray Place 2 sprays into both nostrils daily. 18 mL Lynden Oxford Scales, PA-C   cetirizine (ZYRTEC ALLERGY) 10 MG tablet Take 1 tablet (10 mg  total) by mouth at bedtime. 90 tablet Lynden Oxford Scales, [place]      PDMP not reviewed this encounter.  Pending results:  Labs Reviewed - No data to display  Medications Ordered in UC: Medications - No data to display  Disposition Upon Discharge:  Condition: stable for discharge home Home: take medications as prescribed; routine discharge instructions as discussed; follow up as advised.  Patient presented with an acute illness with associated systemic symptoms and significant discomfort requiring urgent management. In my opinion, this is a condition that a prudent lay person (someone who possesses an average knowledge of health and medicine) Tara potentially expect to result in complications if  not addressed urgently such as respiratory distress, impairment of bodily function or dysfunction of bodily organs.   Routine symptom specific, illness specific and/or disease specific instructions were discussed with the patient and/or caregiver at length.   As such, the patient has been evaluated and assessed, work-up was performed and treatment was provided in alignment with urgent Mullins protocols and evidence based medicine.  Patient/parent/caregiver has been advised that the patient Tara require follow up for further testing and treatment if the symptoms continue in spite of treatment, as clinically indicated and appropriate.  If the patient was tested for COVID-19, Influenza and/or RSV, then the patient/parent/guardian was advised to isolate at home pending the results of his/her diagnostic coronavirus test and potentially longer if theyre positive. I have also advised pt that if his/her COVID-19 test returns positive, it's recommended to self-isolate for at least 10 days after symptoms first appeared AND until fever-free for 24 hours without fever reducer AND other symptoms have improved or resolved. Discussed self-isolation recommendations as well as instructions for household member/close contacts as per the [facility] and [place] DHHS, and also gave patient the [place] packet with this information.  Patient/parent/caregiver has been advised to return to the [facility] or PCP in 3-5 days if no better; to PCP or the Emergency Department if new signs and symptoms develop, or if the current signs or symptoms continue to change or worsen for further workup, evaluation and treatment as clinically indicated and appropriate  The patient will follow up with their current PCP if and as advised. If the patient does not currently have a PCP we will assist them in obtaining one.   The patient Tara need specialty follow up if the symptoms continue, in spite of conservative treatment and management, for further workup, evaluation,  consultation and treatment as clinically indicated and appropriate.  Patient/parent/caregiver verbalized understanding and agreement of plan as discussed.  All questions were addressed during visit.  Please see discharge instructions below for further details of plan.  Discharge Instructions:   Discharge Instructions      Your symptoms and my physical exam findings are concerning for exacerbation of your underlying allergies.  Please begin the following allergy medications as well:   Fluticasone (Flonase): This is a steroid nasal spray that you use once daily, 1 spray in each nare.  After 3 to 5 days of use, you will have significant improvement of the inflammation and mucus production that is being caused by exposure to allergens.  This medication can be purchased over-the-counter however I have provided you with a prescription.      Cetirizine (Zyrtec): This is an excellent second-generation antihistamine that helps to reduce respiratory inflammatory response to viruses and environmental allergens.  Please take 1 tablet daily at bedtime.  It is important that you are consistent with taking allergy medications exactly  as prescribed.  Not doing so increases your risk of more frequent upper respiratory infections that Tara or Tara not require the use of antibiotics, serious exacerbations that require the use of oral steroids, loss of time at work and missed social opportunities.   Your symptoms and my physical exam findings are concerning for exacerbation of your underlying asthma.  I provided you with a prescription of albuterol and methylprednisolone.  Please take both exactly as prescribed.  Please also continue taking Singulair, 1 tablet every evening at bedtime.    Methylprednisolone (Medrol Dosepak): This is a steroid that will significantly calm your upper and lower airways, please take one row of tablets daily with your breakfast meal starting tomorrow morning until the prescription is  complete.      Albuterol HFA: This is a bronchodilator, it relaxes the smooth muscles that constrict your airway in your lungs when you are feeling sick or having inflammation secondary to allergies or upper respiratory infection.  Please inhale 2 puffs twice daily every day using the spacer provided.  You can also inhale 2 more puffs as often as needed throughout the day for aggravating cough, chest tightness, feeling short of breath, wheezing.      It is important that you are consistent with taking your asthma medications exactly as prescribed.  Not doing so increases your risk of more frequent upper respiratory infections that Tara or Tara not require the use of antibiotics, more serious asthma exacerbations that Tara require the use of stronger oral steroids, also time at work and missed social opportunities.  You also significantly increase your risk of hospitalization secondary to respiratory infections.           Once you are feeling better from this episode, you can decrease and discontinue use of albuterol.  I would like for you to continue taking Flonase, cetirizine and montelukast however because these medications are effective at preventing you from having any future episodes such as this 1.            Please follow-up within the next 3 to 5 days either with your primary Mullins provider or urgent Mullins if your symptoms do not resolve.  If you do not have a primary Mullins provider, we will assist you in finding one.  Thank you for visiting urgent Mullins today.  I appreciate the opportunity to participate in your Mullins.       This office note has been dictated using Museum/gallery curator.  Unfortunately, and despite my best efforts, this method of dictation can sometimes lead to occasional typographical or grammatical errors.  I apologize in advance if this occurs.     Lynden Oxford Scales, PA-C 06/21/21 1845

## 2021-06-21 NOTE — ED Triage Notes (Signed)
Patient c/o productive cough, no fever x 1 month, itchy throat.  Patient has taken Singulair for the cough, no history of asthma.

## 2021-08-06 ENCOUNTER — Other Ambulatory Visit: Payer: Self-pay | Admitting: Family Medicine

## 2021-08-06 DIAGNOSIS — Z1231 Encounter for screening mammogram for malignant neoplasm of breast: Secondary | ICD-10-CM

## 2021-08-10 ENCOUNTER — Ambulatory Visit
Admission: EM | Admit: 2021-08-10 | Discharge: 2021-08-10 | Disposition: A | Discharge status: Home / Self Care | Payer: 59 | Attending: Physician Assistant | Admitting: Physician Assistant

## 2021-08-10 ENCOUNTER — Other Ambulatory Visit: Payer: Self-pay

## 2021-08-10 DIAGNOSIS — J069 Acute upper respiratory infection, unspecified: Secondary | ICD-10-CM | POA: Diagnosis not present

## 2021-08-10 DIAGNOSIS — Z1152 Encounter for screening for COVID-19: Secondary | ICD-10-CM

## 2021-08-10 LAB — POCT RAPID STREP A (OFFICE): Rapid Strep A Screen: NEGATIVE

## 2021-08-10 NOTE — ED Provider Notes (Signed)
?[facility] ? ? ? ?CSN: [id number] ?Arrival date & time: 08/10/21  1130 ? ? ?  ? ?History   ?Chief Complaint ?Chief Complaint  ?Patient presents with  ? Cough  ? Nasal Congestion  ? ? ?HPI ?[place] [address] Tara Mullins is a 51 y.o. female.  ? ?Patient here today for evaluation of fever, sneezing, headache, chills, itchy throat, cough and nasal congestion that started yesterday.  She reports symptoms feel worse today.  Patient does work in [employer].  She has tried taking Flonase, Singulair, Tylenol and Zyrtec without significant relief. ? ?The history is provided by the patient.  ?Cough ?Associated symptoms: chills, fever, headaches and sore throat   ?Associated symptoms: no ear pain, no eye discharge, no shortness of breath and no wheezing   ? ?Past Medical History:  ?Diagnosis Date  ? Allergy   ? ? ?Patient Active Problem List  ? Diagnosis Date Noted  ? Acute appendicitis 03/16/2015  ? ? ?Past Surgical History:  ?Procedure Laterality Date  ? BREAST BIOPSY Right 2018  ? fibroadenoma  ? LAPAROSCOPIC APPENDECTOMY N/A 03/17/2015  ? Procedure: APPENDECTOMY LAPAROSCOPIC;  Surgeon: Coralie Keens, MD;  Location: [facility];  Service: General;  Laterality: N/A;  ? ? ?OB History   ?No obstetric history on file. ?  ? ? ? ?Home Medications   ? ?Prior to Admission medications   ?Medication Sig Start Date End Date Taking? Authorizing Provider  ?albuterol (VENTOLIN HFA) 108 (90 Base) MCG/ACT inhaler Inhale 2 puffs into the lungs every 6 (six) hours as needed for wheezing or shortness of breath (Cough). 06/21/21   Lynden Oxford Scales, PA-C  ?amLODipine (NORVASC) 5 MG tablet Take 1 tablet (5 mg total) by mouth daily. 11/14/20   Wieters, Elesa Hacker, PA-C  ?Ascorbic Acid (VITAMIN C) 500 MG CHEW Chew 1 tablet by mouth daily.    [provider]  ?cetirizine (ZYRTEC ALLERGY) 10 MG tablet Take 1 tablet (10 mg total) by mouth at bedtime. 06/21/21 09/19/21  Lynden Oxford Scales, PA-C  ?Cyanocobalamin (VITAMIN B12 PO)  Take 1 tablet by mouth daily.    [provider]  ?fluticasone (FLONASE) 50 MCG/ACT nasal spray Place 2 sprays into both nostrils daily. 06/21/21   Lynden Oxford Scales, PA-C  ?methylPREDNISolone (MEDROL DOSEPAK) 4 MG TBPK tablet Take 24 mg on day 1 (all 6 tablets at once), 20 mg on day 2 (all 5 tablets at once), 16 mg on day 3 (all 4 tablets at once) 12 mg on day 4 (all 3 tablets at once), 8 mg on day 5 (both tablets at once), 4 mg on day 6. 06/21/21   Lynden Oxford Scales, PA-C  ?montelukast (SINGULAIR) 10 MG tablet Take 1 tablet (10 mg total) by mouth at bedtime. 06/21/21 09/19/21  Lynden Oxford Scales, PA-C  ?Multiple Vitamins-Minerals (HM MULTIVITAMIN ADULT GUMMY PO) Take 1 tablet by mouth daily.    [provider]  ?naproxen (NAPROSYN) 500 MG tablet Take 1 tablet (500 mg total) by mouth 2 (two) times daily. Begin as needed for knee pain/ headaches after completing prednisone course 11/14/20   Janith Lima, PA-C  ? ? ?Family History ?Family History  ?Problem Relation Age of Onset  ? Breast cancer Mother   ? ? ?Social History ?Social History  ? ?Tobacco Use  ? Smoking status: Never  ? Smokeless tobacco: Never  ?Substance Use Topics  ? Alcohol use: No  ? Drug use: No  ? ? ? ?Allergies   ?Patient has no known allergies. ? ? ?  Review of Systems ?Review of Systems  ?Constitutional:  Positive for chills and fever.  ?HENT:  Positive for congestion and sore throat. Negative for ear pain.   ?Eyes:  Negative for discharge and redness.  ?Respiratory:  Positive for cough. Negative for shortness of breath and wheezing.   ?Gastrointestinal:  Negative for abdominal pain, diarrhea, nausea and vomiting.  ?Neurological:  Positive for headaches.  ? ? ?Physical Exam ?Triage Vital Signs ?ED Triage Vitals  ?Enc Vitals Group  ?   BP   ?   Pulse   ?   Resp   ?   Temp   ?   Temp src   ?   SpO2   ?   Weight   ?   Height   ?   Head Circumference   ?   Peak Flow   ?   Pain Score   ?   Pain Loc   ?   Pain Edu?   ?    Excl. in [place]?   ? ?No data found. ? ?Updated Vital Signs ?BP 139/71 (BP Location: Left Arm)   Pulse 89   Temp 100.3 ?F (37.9 ?C) (Oral)   Resp 16   LMP 11/01/2019   SpO2 96%  ?   ? ?Physical Exam ?Vitals and nursing note reviewed.  ?Constitutional:   ?   General: She is not in acute distress. ?   Appearance: Normal appearance. She is not ill-appearing.  ?HENT:  ?   Head: Normocephalic and atraumatic.  ?   Nose: Congestion present.  ?   Mouth/Throat:  ?   Mouth: Mucous membranes are moist.  ?   Pharynx: No oropharyngeal exudate or posterior oropharyngeal erythema.  ?Eyes:  ?   Conjunctiva/sclera: Conjunctivae normal.  ?Cardiovascular:  ?   Rate and Rhythm: Normal rate and regular rhythm.  ?   Heart sounds: Normal heart sounds. No murmur heard. ?Pulmonary:  ?   Effort: Pulmonary effort is normal. No respiratory distress.  ?   Breath sounds: Normal breath sounds. No wheezing, rhonchi or rales.  ?Skin: ?   General: Skin is warm and dry.  ?Neurological:  ?   Mental Status: She is alert.  ?Psychiatric:     ?   Mood and Affect: Mood normal.     ?   Thought Content: Thought content normal.  ? ? ? ?UC Treatments / Results  ?Labs ?(all labs ordered are listed, but only abnormal results are displayed) ?Labs Reviewed  ?COVID-19, FLU A+B NAA  ?POCT RAPID STREP A (OFFICE)  ? ? ?EKG ? ? ?Radiology ?No results found. ? ?Procedures ?Procedures (including critical care time) ? ?Medications Ordered in UC ?Medications - No data to display ? ?Initial Impression / Assessment and Plan / UC Course  ?I have reviewed the triage vital signs and the nursing notes. ? ?Pertinent labs & imaging results that were available during my care of the patient were reviewed by me and considered in my medical decision making (see chart for details). ? ?  ?Strep test negative in office.  Suspect likely viral etiology of symptoms and will order screening for COVID and flu.  Encouraged OTC symptomatic treatment and follow-up if symptoms fail to improve or  worsen. ? ?Final Clinical Impressions(s) / UC Diagnoses  ? ?Final diagnoses:  ?Encounter for screening for COVID-19  ?Viral upper respiratory tract infection  ? ?Discharge Instructions   ?None ?  ? ?ED Prescriptions   ?None ?  ? ?PDMP not reviewed this  encounter. ?  ?Francene Finders, PA-C ?08/10/21 1626 ? ?

## 2021-08-10 NOTE — ED Triage Notes (Signed)
Patient presents to Urgent Care with complaints of sneezing, headache, chills, itchy throat, cough and nasal congestion since yesterday. She states symptoms worse today. Treating symptoms with Flonase, Singulair, tylenol and zyrtec.  ?

## 2021-08-12 LAB — COVID-19, FLU A+B NAA
Influenza A, NAA: NOT DETECTED
Influenza B, NAA: NOT DETECTED
SARS-CoV-2, NAA: NOT DETECTED

## 2021-09-20 ENCOUNTER — Other Ambulatory Visit: Payer: Self-pay | Admitting: Family Medicine

## 2021-09-20 ENCOUNTER — Ambulatory Visit
Admission: RE | Admit: 2021-09-20 | Discharge: 2021-09-20 | Disposition: A | Discharge status: Home / Self Care | Payer: 59 | Source: Ambulatory Visit | Attending: Family Medicine | Admitting: Family Medicine

## 2021-09-20 DIAGNOSIS — J45909 Unspecified asthma, uncomplicated: Secondary | ICD-10-CM

## 2021-10-03 ENCOUNTER — Other Ambulatory Visit: Payer: Self-pay | Admitting: Pulmonary Disease

## 2021-10-03 ENCOUNTER — Encounter: Payer: Self-pay | Admitting: Pulmonary Disease

## 2021-10-03 ENCOUNTER — Ambulatory Visit: Payer: 59 | Admitting: Pulmonary Disease

## 2021-10-03 VITALS — BP 122/68 | HR 84 | Temp 98.3°F | Ht 60.0 in | Wt 107.8 lb

## 2021-10-03 DIAGNOSIS — R0602 Shortness of breath: Secondary | ICD-10-CM | POA: Diagnosis not present

## 2021-10-03 DIAGNOSIS — J4551 Severe persistent asthma with (acute) exacerbation: Secondary | ICD-10-CM

## 2021-10-03 LAB — CBC WITH DIFFERENTIAL/PLATELET
Basophils Absolute: 0.1 10*3/uL (ref 0.0–0.1)
Basophils Relative: 1.4 % (ref 0.0–3.0)
Eosinophils Absolute: 0.4 10*3/uL (ref 0.0–0.7)
Eosinophils Relative: 8.5 % — ABNORMAL HIGH (ref 0.0–5.0)
HCT: 41.8 % (ref 36.0–46.0)
Hemoglobin: 13.5 g/dL (ref 12.0–15.0)
Lymphocytes Relative: 47.1 % — ABNORMAL HIGH (ref 12.0–46.0)
Lymphs Abs: 2.4 10*3/uL (ref 0.7–4.0)
MCHC: 32.3 g/dL (ref 30.0–36.0)
MCV: 92.4 fl (ref 78.0–100.0)
Monocytes Absolute: 0.4 10*3/uL (ref 0.1–1.0)
Monocytes Relative: 8.1 % (ref 3.0–12.0)
Neutro Abs: 1.8 10*3/uL (ref 1.4–7.7)
Neutrophils Relative %: 34.9 % — ABNORMAL LOW (ref 43.0–77.0)
Platelets: 322 10*3/uL (ref 150.0–400.0)
RBC: 4.52 Mil/uL (ref 3.87–5.11)
RDW: 12.6 % (ref 11.5–15.5)
WBC: 5.1 10*3/uL (ref 4.0–10.5)

## 2021-10-03 LAB — POCT EXHALED NITRIC OXIDE: FeNO level (ppb): 89

## 2021-10-03 MED ORDER — TRELEGY ELLIPTA 100-62.5-25 MCG/ACT IN AEPB: 1.0000 | INHALATION_SPRAY | Freq: Every day | RESPIRATORY_TRACT | 5 refills | Status: DC

## 2021-10-03 MED ORDER — PREDNISONE 10 MG PO TABS: ORAL_TABLET | ORAL | 0 refills | Status: AC

## 2021-10-03 MED ORDER — AZELASTINE HCL 0.15 % NA SOLN: 1.0000 | Freq: Two times a day (BID) | NASAL | 5 refills | Status: DC

## 2021-10-03 NOTE — Progress Notes (Signed)
? ?[facility] ? ?Chief Complaint  ?Patient presents with  ? Consult  ?  SOB/ Coughing/ wheezing for years   ? ? ?Past Surgical History:  ?She  has a past surgical history that includes laparoscopic appendectomy (N/A, 03/17/2015) and Breast biopsy (Right, 2018). ? ?Past Medical History:  ?Allergies, Positive PPD ? ?Constitutional:  ?BP 122/68 (BP Location: Left Arm, Patient Position: Sitting, Cuff Size: Normal)   Pulse 84   Temp 98.3 ?F (36.8 ?C) (Oral)   Ht 5' (1.524 m)   Wt 107 lb 12.8 oz (48.9 kg)   LMP 11/01/2019   SpO2 97%   BMI 21.05 kg/m?  ? ?Brief Summary:  ?Tara Mullins is a 51 y.o. female with cough and wheeze. ?  ? ? ? ?Subjective:  ? ?She is from the [place].  She has lived in [place] for 13 years.  She never smoked cigarettes.  She works as a Merchandiser, retail. ? ?She has noticed trouble with her breathing for several years.  She gets allergies with sinus congestion and post nasal drip.  She gets frequent episodes of bronchitis.  Her more recent episode occcured in April.  She went to urgent care.  Chest xray from 09/20/21 was normal.  She was started on medrol and then zithromax.  These helped.  She was started on fluticasone nasal spray, montelukast, and advair last week.  These have helped, but she still has symptoms.   ? ?She is getting sinus congestion with post nasal drip.  She has cough with white to yellow sputum.  She wakes up at night with a cough.  She will get wheeze and tightness in her chest.  She does not snore. ? ?She denies food or medication allergies.  No history of skin rashes, or leg swelling.  No history of pneumonia.  She denies animal bird/exposures.  She is not aware of any respiratory conditions that run in her family. ? ?Physical Exam:  ? ?Appearance - well kempt  ? ?ENMT - no sinus tenderness, no oral exudate, no LAN, Mallampati 4 airway, no stridor, scalloped tongue ? ?Respiratory - bilateral rhonchi with  faint wheezing ? ?CV - s1s2 regular rate and rhythm, no murmurs ? ?Ext - no clubbing, no edema ? ?Skin - no rashes ? ?Psych - normal mood and affect ?  ?Pulmonary testing:  ?FeNO 10/03/21 >> 89 ? ?Chest Imaging:  ? ? ?Social History:  ?She  reports that she has never smoked. She has never used smokeless tobacco. She reports that she does not drink alcohol and does not use drugs. ? ?Family History:  ?Her family history includes Breast cancer in her mother. ?  ? ?Discussion:  ?Her symptoms are consistent with allergic asthma that is exacerbated by respiratory infections and environmental exposures. ? ?Assessment/Plan:  ? ?Severe, persistent allergic asthma. ?- she has been on systemic steroids twice since January 2023 ?- she has persistent exacerbation and needs additional course of prednisone ?- will check CBC with diff, IgE, PFT ?- will switch from advair to trelegy 100 one puff daily ?- continue singulair ?- prn albuterol ? ?Allergic rhinitis. ?- continue fluticasone nasal spray, montelukast, cetirizine ?- add azelastine ? ?Time Spent Involved in Patient Care on Day of Examination:  ?50 minutes ? ?Follow up:  ? ?Patient Instructions  ?Lab tests today ? ?Stop using advair ? ?Start using trelegy one puff daily and rinse your mouth after each use ? ?Start using azelastine 1 spray in  each nostril in the morning and in the evening ? ?Continue using fluticasone nose spray daily, montelukast 10 mg daily, and cetirizine 10 mg daily ? ?Albuterol two puffs every 6 hours as needed for cough, wheeze, or chest congestion ? ?Prednisone 10 mg pill >> 3 pills daily for 2 days, 2 pills daily for 2 days, 1 pill daily for 2 days ? ?Will arrange for pulmonary function test and follow up in 6 weeks ? ?Medication List:  ? ?Allergies as of 10/03/2021   ?No Known Allergies ?  ? ?  ?Medication List  ?  ? ?  ? Accurate as of Tara 10, 2023 11:48 AM. If you have any questions, ask your nurse or doctor.  ?  ?  ? ?  ? ?STOP taking these  medications   ? ?Advair Diskus 100-50 MCG/ACT Aepb ?Generic drug: fluticasone-salmeterol ?Stopped by: Chesley Mires, MD ?  ?methylPREDNISolone 4 MG Tbpk tablet ?Commonly known as: MEDROL DOSEPAK ?Stopped by: Chesley Mires, MD ?  ? ?  ? ?TAKE these medications   ? ?albuterol 108 (90 Base) MCG/ACT inhaler ?Commonly known as: VENTOLIN HFA ?Inhale 2 puffs into the lungs every 6 (six) hours as needed for wheezing or shortness of breath (Cough). ?  ?amLODipine 5 MG tablet ?Commonly known as: NORVASC ?Take 1 tablet (5 mg total) by mouth daily. ?  ?Azelastine HCl 0.15 % Soln ?Commonly known as: Astepro ?Place 1 spray into the nose 2 (two) times daily. ?Started by: Chesley Mires, MD ?  ?cetirizine 10 MG tablet ?Commonly known as: ZyrTEC Allergy ?Take 1 tablet (10 mg total) by mouth at bedtime. ?  ?fluticasone 50 MCG/ACT nasal spray ?Commonly known as: FLONASE ?Place 2 sprays into both nostrils daily. ?  ?HM MULTIVITAMIN ADULT GUMMY PO ?Take 1 tablet by mouth daily. ?  ?montelukast 10 MG tablet ?Commonly known as: Singulair ?Take 1 tablet (10 mg total) by mouth at bedtime. ?  ?naproxen 500 MG tablet ?Commonly known as: NAPROSYN ?Take 1 tablet (500 mg total) by mouth 2 (two) times daily. Begin as needed for knee pain/ headaches after completing prednisone course ?  ?predniSONE 10 MG tablet ?Commonly known as: DELTASONE ?Take 3 tablets (30 mg total) by mouth daily with breakfast for 2 days, THEN 2 tablets (20 mg total) daily with breakfast for 2 days, THEN 1 tablet (10 mg total) daily with breakfast for 2 days. ?Start taking on: Tara 10, 2023 ?Started by: Chesley Mires, MD ?  ?Trelegy Ellipta 100-62.5-25 MCG/ACT Aepb ?Generic drug: Fluticasone-Umeclidin-Vilant ?Inhale 1 puff into the lungs daily in the afternoon. ?Started by: Chesley Mires, MD ?  ?VITAMIN B12 PO ?Take 1 tablet by mouth daily. ?  ?Vitamin C 500 MG Chew ?Chew 1 tablet by mouth daily. ?  ? ?  ? ? ?Signature:  ?Chesley Mires, MD ?[facility] ?Pager - [phone]  [phone] ?10/03/2021, 11:48 AM ?  ? ? ? ? ? ? ? ? ?

## 2021-10-03 NOTE — Patient Instructions (Signed)
Lab tests today ? ?Stop using advair ? ?Start using trelegy one puff daily and rinse your mouth after each use ? ?Start using azelastine 1 spray in each nostril in the morning and in the evening ? ?Continue using fluticasone nose spray daily, montelukast 10 mg daily, and cetirizine 10 mg daily ? ?Albuterol two puffs every 6 hours as needed for cough, wheeze, or chest congestion ? ?Prednisone 10 mg pill >> 3 pills daily for 2 days, 2 pills daily for 2 days, 1 pill daily for 2 days ? ?Will arrange for pulmonary function test and follow up in 6 weeks ?

## 2021-10-04 LAB — IGE: IgE (Immunoglobulin E), Serum: 16 kU/L (ref <=114)

## 2021-11-26 ENCOUNTER — Ambulatory Visit
Admission: RE | Admit: 2021-11-26 | Discharge: 2021-11-26 | Disposition: A | Discharge status: Home / Self Care | Payer: 59 | Source: Ambulatory Visit | Attending: Family Medicine | Admitting: Family Medicine

## 2021-11-26 DIAGNOSIS — Z1231 Encounter for screening mammogram for malignant neoplasm of breast: Secondary | ICD-10-CM

## 2021-12-13 ENCOUNTER — Telehealth: Payer: Self-pay | Admitting: Family Medicine

## 2021-12-13 NOTE — Telephone Encounter (Signed)
Received call from patient. She speaks broken [place]. She would like her records faxed to Dr. Berenice Primas at [facility]. Verbal Auth accepted. Records faxed [phone].

## 2021-12-18 ENCOUNTER — Telehealth: Payer: Self-pay | Admitting: Family Medicine

## 2021-12-18 NOTE — Telephone Encounter (Signed)
Received call from patient stating Tara Mullins didn't receive records faxed on 12/13/21. I advised patient I would refax and to follow up with their office in an hour. I re-faxed records [phone]

## 2022-02-15 ENCOUNTER — Telehealth: Payer: Self-pay | Admitting: Primary Care

## 2022-02-15 ENCOUNTER — Ambulatory Visit: Payer: 59 | Admitting: Primary Care

## 2022-02-15 ENCOUNTER — Encounter: Payer: Self-pay | Admitting: Primary Care

## 2022-02-15 ENCOUNTER — Other Ambulatory Visit (HOSPITAL_COMMUNITY): Payer: Self-pay

## 2022-02-15 VITALS — BP 112/68 | HR 68 | Temp 97.9°F | Ht 60.0 in | Wt 109.2 lb

## 2022-02-15 DIAGNOSIS — J309 Allergic rhinitis, unspecified: Secondary | ICD-10-CM

## 2022-02-15 DIAGNOSIS — R0602 Shortness of breath: Secondary | ICD-10-CM

## 2022-02-15 DIAGNOSIS — J4550 Severe persistent asthma, uncomplicated: Secondary | ICD-10-CM | POA: Diagnosis not present

## 2022-02-15 MED ORDER — BUDESONIDE-FORMOTEROL FUMARATE 160-4.5 MCG/ACT IN AERO: 2.0000 | INHALATION_SPRAY | Freq: Two times a day (BID) | RESPIRATORY_TRACT | 6 refills | Status: DC

## 2022-02-15 MED ORDER — PREDNISONE 10 MG PO TABS: ORAL_TABLET | ORAL | 0 refills | Status: DC

## 2022-02-15 MED ORDER — MONTELUKAST SODIUM 10 MG PO TABS
10.0000 mg | ORAL_TABLET | Freq: Every day | ORAL | 3 refills | Status: DC
Start: 2022-02-15 — End: 2023-12-29

## 2022-02-15 NOTE — Telephone Encounter (Signed)
Test billing for this patient's plan returns the  following results for products:  ICS+LABA+LAMA Trelegy $60.00 Breztri  $40.00   ICS/LABA Advair disKus $0.00 Advair hfa $60.00 Breo $60.00 Dulera $60.00 Symbicort $0.00  Selinda Orion CPhT Rx Patient Advocate

## 2022-02-15 NOTE — Progress Notes (Unsigned)
$'@Patient'w$  ID: Tara Mullins, female    DOB: 07/18/1970, 51 y.o.   MRN: [id number]  No chief complaint on file.   Referring provider: Jonathon Jordan, MD  HPI: 51 year old female.  Patient of Dr. Halford Chessman, seen for initial consult on 10/03/2021.  Previous [facility] pulmonary encounter: 10/03/21- Dr. Halford Chessman  She is from the [place].  She has lived in [place] for 13 years.  She never smoked cigarettes.  She works as a Merchandiser, retail.   She has noticed trouble with her breathing for several years.  She gets allergies with sinus congestion and post nasal drip.  She gets frequent episodes of bronchitis.  Her more recent episode occcured in April.  She went to urgent care.  Chest xray from 09/20/21 was normal.  She was started on medrol and then zithromax.  These helped.  She was started on fluticasone nasal spray, montelukast, and advair last week.  These have helped, but she still has symptoms.     She is getting sinus congestion with post nasal drip.  She has cough with white to yellow sputum.  She wakes up at night with a cough.  She will get wheeze and tightness in her chest.  She does not snore.   She denies food or medication allergies.  No history of skin rashes, or leg swelling.  No history of pneumonia.  She denies animal bird/exposures.  She is not aware of any respiratory conditions that run in her family.  Severe, persistent allergic asthma. - she has been on systemic steroids twice since January 2023 - she has persistent exacerbation and needs additional course of prednisone - will check CBC with diff, IgE, PFT - will switch from advair to trelegy 100 one puff daily - continue singulair - prn albuterol   Allergic rhinitis. - continue fluticasone nasal spray, montelukast, cetirizine - add azelastine   02/15/2022- Interim hx  Patient presents today for overdue follow-up for severe persistent allergic asthma. She was last seen in [place] and was changed from Advair to  Trelegy 120mg and given prednisone taper.   Maintained on azelastine, fluticasone, montelukast and cetirizine for allergic rhinitis.  Not coughing. She can sleep. PCP refilled trelegy. After she stopped, coughing  She is not currently using any inhaler. She is taking montelukast, cetirizine and azestline.  Slept better when using   Main stymptoms are cough and wheezr at night  Uses nasal sprayu  Sym[toms are worse early in morning. Cough and wheezing  She has shortness of breath when she has no medication  No shortness of breath when  10/03/21 - FENO 89 10/03/21 -Eos absolute 400   No Known Allergies  Immunization History  Administered Date(s) Administered   Hepatitis B, adult 01/16/2018   Influenza Inj Mdck Quad Pf 01/17/2018   Influenza Split 04/03/2012, 01/02/2018, 01/04/2019, 01/27/2020   Influenza,inj,Quad PF,6+ Mos 01/11/2019   Influenza,inj,quad, With Preservative 03/09/2015   Influenza-Unspecified 01/11/2019   PFIZER(Purple Top)SARS-COV-2 Vaccination 07/01/2019, 07/22/2019, 03/08/2020, 10/25/2020   Pneumococcal Conjugate-13 01/04/2019, 01/11/2019, 01/27/2020    Past Medical History:  Diagnosis Date   Allergy     Tobacco History: Social History   Tobacco Use  Smoking Status Never  Smokeless Tobacco Never   Counseling given: Not Answered   Outpatient Medications Prior to Visit  Medication Sig Dispense Refill   albuterol (VENTOLIN HFA) 108 (90 Base) MCG/ACT inhaler Inhale 2 puffs into the lungs every 6 (six) hours as needed for wheezing or shortness of breath (Cough). 18 g 0  amLODipine (NORVASC) 5 MG tablet Take 1 tablet (5 mg total) by mouth daily. 60 tablet 0   Ascorbic Acid (VITAMIN C) 500 MG CHEW Chew 1 tablet by mouth daily.     Azelastine HCl 137 MCG/SPRAY SOLN PLACE 1 SPRAY INTO THE NOSE 2 (TWO) TIMES DAILY. 30 mL 5   cetirizine (ZYRTEC ALLERGY) 10 MG tablet Take 1 tablet (10 mg total) by mouth at bedtime. 90 tablet 0   Cyanocobalamin (VITAMIN B12 PO)  Take 1 tablet by mouth daily.     fluticasone (FLONASE) 50 MCG/ACT nasal spray Place 2 sprays into both nostrils daily. 18 mL 0   Fluticasone-Umeclidin-Vilant (TRELEGY ELLIPTA) 100-62.5-25 MCG/ACT AEPB Inhale 1 puff into the lungs daily in the afternoon. 28 each 5   montelukast (SINGULAIR) 10 MG tablet Take 1 tablet (10 mg total) by mouth at bedtime. 90 tablet 0   Multiple Vitamins-Minerals (HM MULTIVITAMIN ADULT GUMMY PO) Take 1 tablet by mouth daily.     naproxen (NAPROSYN) 500 MG tablet Take 1 tablet (500 mg total) by mouth 2 (two) times daily. Begin as needed for knee pain/ headaches after completing prednisone course 30 tablet 0   No facility-administered medications prior to visit.      Review of Systems  Review of Systems   Physical Exam  LMP 11/01/2019  Physical Exam   Lab Results:  CBC    Component Value Date/Time   WBC 5.1 10/03/2021 1153   RBC 4.52 10/03/2021 1153   HGB 13.5 10/03/2021 1153   HCT 41.8 10/03/2021 1153   PLT 322.0 10/03/2021 1153   MCV 92.4 10/03/2021 1153   MCH 28.6 03/17/2015 0408   MCHC 32.3 10/03/2021 1153   RDW 12.6 10/03/2021 1153   LYMPHSABS 2.4 10/03/2021 1153   MONOABS 0.4 10/03/2021 1153   EOSABS 0.4 10/03/2021 1153   BASOSABS 0.1 10/03/2021 1153    BMET    Component Value Date/Time   NA 139 03/17/2015 0408   K 3.6 03/17/2015 0408   CL 106 03/17/2015 0408   CO2 24 03/17/2015 0408   GLUCOSE 98 03/17/2015 0408   BUN 7 03/17/2015 0408   CREATININE 0.63 03/17/2015 0408   CALCIUM 8.7 (L) 03/17/2015 0408   GFRNONAA >60 03/17/2015 0408   GFRAA >60 03/17/2015 0408    BNP No results found for: "BNP"  ProBNP No results found for: "PROBNP"  Imaging: No results found.   Assessment & Plan:   No problem-specific Assessment & Plan notes found for this encounter.     Martyn Ehrich, NP 02/15/2022

## 2022-02-15 NOTE — Telephone Encounter (Signed)
Please let patient know inhaler similar to Advair called Symbicort would be 0 dollars. I will send in RX, she should take two puffs every morning and evening. I will also send in prednisone taper.

## 2022-02-15 NOTE — Patient Instructions (Signed)
Recommendations - We will resume either Advair or Trelegy inhaler depending on cost/insurance approval (I will send in prescription and we will call you to let you know which one is cheapest) - Continue montelukast (Singulair) 10 mg at bedtime - Continue cetirizine (Zyrtec) 10 mg daily - Continue Astelin nasal spray 1 puff per nostril twice daily - Use Ventolin inhaler 2 puffs every 4-6 hours as needed for shortness of breath or wheezing  Orders: - FENO re: asthma   Follow-up: - 6 months with Dr. Halford Chessman or sooner if needed if asthma symptoms flare

## 2022-02-15 NOTE — Telephone Encounter (Signed)
Can you do benefits investigation for Trelegy 100 and Advair disc or HFA

## 2022-02-15 NOTE — Telephone Encounter (Signed)
Called and spoke with pt letting her know the info from BW and she verbalized understanding. Verified which pharmacy pt was needing to have meds sent to and she said to make sure meds were sent to [facility] off of [address]. While speaking with pt, she also stated that she was needing a refill of her montelukast so Rx has been sent to pharmacy for pt. Nothing further needed.

## 2022-02-19 ENCOUNTER — Other Ambulatory Visit: Payer: Self-pay

## 2022-02-19 DIAGNOSIS — J4550 Severe persistent asthma, uncomplicated: Secondary | ICD-10-CM | POA: Insufficient documentation

## 2022-02-19 DIAGNOSIS — J309 Allergic rhinitis, unspecified: Secondary | ICD-10-CM | POA: Insufficient documentation

## 2022-02-19 NOTE — Progress Notes (Signed)
Reviewed and agree with assessment/plan.   Chesley Mires, MD [facility] Pulmonary/Critical Care 02/19/2022, 10:40 AM Pager:  [phone]

## 2022-02-19 NOTE — Assessment & Plan Note (Addendum)
-   She has symptoms of nocturnal cough and wheeze which are worse when not consistently using ICS/LABA. Her symptoms are better controlled when consistently using maintenance inhaler. She saw no difference between Trelegy and Advair. Medication cost does affect compliance. Symbicort is covered by insurance and zero dollars. She will send in RX Symbicort 135mg two puffs twice daily and prednisone taper for wheezing on exam. BJudithann Saugeris cheaper alternative for triple therapy if needed. If continues to have exacerbations requiring prednisone with consistent inhaler use we will need to look into addition biologic therapy.

## 2022-02-19 NOTE — Assessment & Plan Note (Addendum)
-   Continue azelastine + fluticasone nasal spray, montelukast '10mg'$  at bedtime and cetirizine '10mg'$  daily

## 2022-04-21 ENCOUNTER — Other Ambulatory Visit: Payer: Self-pay | Admitting: Pulmonary Disease

## 2022-05-06 ENCOUNTER — Other Ambulatory Visit: Payer: Self-pay | Admitting: Pulmonary Disease

## 2022-05-29 ENCOUNTER — Telehealth: Payer: Self-pay | Admitting: Primary Care

## 2022-05-29 MED ORDER — BUDESONIDE-FORMOTEROL FUMARATE 160-4.5 MCG/ACT IN AERO: 2.0000 | INHALATION_SPRAY | Freq: Two times a day (BID) | RESPIRATORY_TRACT | 3 refills | Status: AC

## 2022-05-29 NOTE — Telephone Encounter (Signed)
Rx for pt's symbicort inhaler [facility] Attempted to call pt to let her know this had been done but unable to reach. Left pt a detailed message letting her know that the Rx had been sent. Nothing further needed.

## 2022-05-29 NOTE — Telephone Encounter (Signed)
Spoke to pt and she states that her pharmacy is charging her for her budesonide-formoterol (symbicort) so she gave [place] a call to see if there is another inhaler that she didn't have to pay for. I called the pharmacy to ask why they are charging for pt's medicine when in the past she did not have to pay. The pharmacist stated the pt has to pay because it's a new year and pt has a deductible that needs to be met before her medication can go back to zero cost to her. And that she needs to talk to her insurance. I reiterated what the pharmacist told me to pt. Pt verbalized understanding. Nothing further needed.

## 2022-10-14 ENCOUNTER — Other Ambulatory Visit: Payer: Self-pay | Admitting: Family Medicine

## 2022-10-14 DIAGNOSIS — Z1231 Encounter for screening mammogram for malignant neoplasm of breast: Secondary | ICD-10-CM

## 2022-12-10 ENCOUNTER — Ambulatory Visit
Admission: RE | Admit: 2022-12-10 | Discharge: 2022-12-10 | Disposition: A | Discharge status: Home / Self Care | Payer: 59 | Source: Ambulatory Visit | Attending: Family Medicine | Admitting: Family Medicine

## 2022-12-10 DIAGNOSIS — Z1231 Encounter for screening mammogram for malignant neoplasm of breast: Secondary | ICD-10-CM

## 2022-12-12 ENCOUNTER — Other Ambulatory Visit: Payer: Self-pay | Admitting: Family Medicine

## 2022-12-12 DIAGNOSIS — R928 Other abnormal and inconclusive findings on diagnostic imaging of breast: Secondary | ICD-10-CM

## 2022-12-31 ENCOUNTER — Inpatient Hospital Stay: Admission: RE | Admit: 2022-12-31 | Payer: 59 | Source: Ambulatory Visit

## 2023-01-07 ENCOUNTER — Other Ambulatory Visit: Payer: Self-pay | Admitting: Family Medicine

## 2023-01-07 ENCOUNTER — Ambulatory Visit
Admission: RE | Admit: 2023-01-07 | Discharge: 2023-01-07 | Disposition: A | Discharge status: Home / Self Care | Payer: 59 | Source: Ambulatory Visit | Attending: Family Medicine | Admitting: Family Medicine

## 2023-01-07 ENCOUNTER — Ambulatory Visit: Admission: RE | Admit: 2023-01-07 | Payer: 59 | Source: Ambulatory Visit

## 2023-01-07 DIAGNOSIS — R928 Other abnormal and inconclusive findings on diagnostic imaging of breast: Secondary | ICD-10-CM

## 2023-01-07 DIAGNOSIS — R921 Mammographic calcification found on diagnostic imaging of breast: Secondary | ICD-10-CM

## 2023-01-07 HISTORY — PX: BREAST BIOPSY: SHX20

## 2023-01-28 ENCOUNTER — Other Ambulatory Visit: Payer: Self-pay | Admitting: Medical Genetics

## 2023-01-28 DIAGNOSIS — Z006 Encounter for examination for normal comparison and control in clinical research program: Secondary | ICD-10-CM

## 2023-05-19 ENCOUNTER — Other Ambulatory Visit: Payer: Self-pay | Admitting: Primary Care

## 2023-10-24 ENCOUNTER — Other Ambulatory Visit: Payer: Self-pay | Admitting: Infectious Diseases

## 2023-10-24 ENCOUNTER — Ambulatory Visit
Admission: RE | Admit: 2023-10-24 | Discharge: 2023-10-24 | Disposition: A | Discharge status: Home / Self Care | Source: Ambulatory Visit | Attending: Infectious Diseases | Admitting: Infectious Diseases

## 2023-10-24 DIAGNOSIS — R7612 Nonspecific reaction to cell mediated immunity measurement of gamma interferon antigen response without active tuberculosis: Secondary | ICD-10-CM

## 2023-12-29 ENCOUNTER — Ambulatory Visit: Payer: Self-pay

## 2023-12-29 VITALS — Ht 60.0 in

## 2023-12-29 DIAGNOSIS — Z1321 Encounter for screening for nutritional disorder: Secondary | ICD-10-CM | POA: Diagnosis not present

## 2023-12-29 DIAGNOSIS — Z1231 Encounter for screening mammogram for malignant neoplasm of breast: Secondary | ICD-10-CM

## 2023-12-29 DIAGNOSIS — Z1329 Encounter for screening for other suspected endocrine disorder: Secondary | ICD-10-CM

## 2023-12-29 DIAGNOSIS — Z7689 Persons encountering health services in other specified circumstances: Secondary | ICD-10-CM | POA: Diagnosis not present

## 2023-12-29 DIAGNOSIS — E785 Hyperlipidemia, unspecified: Secondary | ICD-10-CM | POA: Insufficient documentation

## 2023-12-29 DIAGNOSIS — Z13228 Encounter for screening for other metabolic disorders: Secondary | ICD-10-CM

## 2023-12-29 DIAGNOSIS — Z130 Encounter for screening for diseases of the blood and blood-forming organs and certain disorders involving the immune mechanism: Secondary | ICD-10-CM | POA: Diagnosis not present

## 2023-12-29 DIAGNOSIS — J4550 Severe persistent asthma, uncomplicated: Secondary | ICD-10-CM | POA: Diagnosis not present

## 2023-12-29 DIAGNOSIS — Z1159 Encounter for screening for other viral diseases: Secondary | ICD-10-CM | POA: Diagnosis not present

## 2023-12-29 NOTE — Assessment & Plan Note (Signed)
 Asthma managed with Symbicort . Symptoms controlled, occasional coughing triggered by smoke exposure. No montelukast  use due to nightmares. - Continue Symbicort . - Continue regular follow up with pulmonology as scheduled. Appreciate their recs for care.

## 2023-12-29 NOTE — Assessment & Plan Note (Signed)
 New patient establishing care after relocating and changing insurance. 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 - Schedule fasting blood work appointment for next week. - Review lab results via MyChart. - Schedule annual physical exam for next year.

## 2023-12-29 NOTE — Progress Notes (Signed)
 New Patient Office Visit  Subjective    Patient ID: Tara Mullins, female    DOB: 03-10-1971  Age: 53 y.o. MRN: [id number]  CC:  Chief Complaint  Patient presents with   New Patient (Initial Visit)    Discussed the use of AI scribe software for clinical note transcription with the patient, who gave verbal consent to proceed.  History of Present Illness Tara Mullins is a 53 year old female who presents for a new patient establishing care appointment.   Prior PCP was Dr. Reena Duck. Last physical was July 2024.   Screenings:  Colon Cancer: Per patient, last colonoscopy 2021 and recommendation was to repeat in 10 years (will request records) Lung Cancer: N/A Breast Cancer: Order for mammogram placed today  Pap Smear: Will request records from OB/GYN Diabetes: Checking A1c with labs HLD: Checking lipid panel with labs  The ASCVD Risk score (Arnett DK, et al., 2019) failed to calculate for the following reasons:   The systolic blood pressure is missing   Cannot find a previous HDL lab   Cannot find a previous total cholesterol lab   Acute Problems: Asthma - Coughing triggered by smoke exposure - Currently uses Symbicort  inhaler, which is effective for symptoms - Previously used albuterol  inhaler but no longer uses it as it did not help - Previously took Singulair  but discontinued due to nightmares - Followed by pulmonology for asthma management   Hyperlipidemia - Previously prescribed Crestor for elevated cholesterol by previous PCP - Took Crestor for three months - Did not follow up on treatment due to family issues  Preventive health maintenance - Colonoscopy performed two to three years ago with recommendation to repeat in ten years - Due for mammogram;  - Pap smear performed at [facility] without issues   Outpatient Encounter Medications as of 12/29/2023  Medication Sig   Ascorbic Acid (VITAMIN C) 500 MG CHEW Chew 1 tablet  by mouth daily.   Azelastine  HCl 137 MCG/SPRAY SOLN PLACE 1 SPRAY INTO THE NOSE 2 (TWO) TIMES DAILY.   Cyanocobalamin (VITAMIN B12 PO) Take 1 tablet by mouth daily.   Multiple Vitamins-Minerals (HM MULTIVITAMIN ADULT GUMMY PO) Take 1 tablet by mouth daily.   budesonide -formoterol  (SYMBICORT ) 160-4.5 MCG/ACT inhaler Inhale 2 puffs into the lungs 2 (two) times daily.   [DISCONTINUED] albuterol  (VENTOLIN  HFA) 108 (90 Base) MCG/ACT inhaler Inhale 2 puffs into the lungs every 6 (six) hours as needed for wheezing or shortness of breath (Cough).   [DISCONTINUED] cetirizine  (ZYRTEC  ALLERGY) 10 MG tablet Take 1 tablet (10 mg total) by mouth at bedtime.   [DISCONTINUED] montelukast  (SINGULAIR ) 10 MG tablet Take 1 tablet (10 mg total) by mouth at bedtime.   [DISCONTINUED] predniSONE  (DELTASONE ) 10 MG tablet 4 tabs for 2 days, 2 tabs for 2 days, 1 tab for 2 days, stop   No facility-administered encounter medications on file as of 12/29/2023.    Past Medical History:  Diagnosis Date   Allergy     Past Surgical History:  Procedure Laterality Date   BREAST BIOPSY Right 2018   fibroadenoma   BREAST BIOPSY Left 01/07/2023   MM LT BREAST BX W LOC DEV 1ST LESION IMAGE BX SPEC STEREO GUIDE 01/07/2023 [facility]   BREAST BIOPSY Left 01/07/2023   MM LT BREAST BX W LOC DEV EA AD LESION IMG BX SPEC STEREO GUIDE 01/07/2023 [facility]   LAPAROSCOPIC APPENDECTOMY N/A 03/17/2015   Procedure: APPENDECTOMY LAPAROSCOPIC;  Surgeon: Vicenta Poli,  MD;  Location: [facility] OR;  Service: General;  Laterality: N/A;    Family History  Problem Relation Age of Onset   Breast cancer Mother     Social History   Socioeconomic History   Marital status: Married    Spouse name: Not on file   Number of children: Not on file   Years of education: Not on file   Highest education level: Professional school degree (e.g., MD, DDS, DVM, JD)  Occupational History   Not on file  Tobacco Use   Smoking status: Never     Passive exposure: Past   Smokeless tobacco: Never  Substance and Sexual Activity   Alcohol use: No   Drug use: No   Sexual activity: Not on file  Other Topics Concern   Not on file  Social History Narrative   Not on file   Social Drivers of Health   Financial Resource Strain: Medium Risk (12/27/2023)   Overall Financial Resource Strain (CARDIA)    Difficulty of Paying Living Expenses: Somewhat hard  Food Insecurity: Food Insecurity Present (12/27/2023)   Hunger Vital Sign    Worried About Running Out of Food in the Last Year: Often true    Ran Out of Food in the Last Year: Often true  Transportation Needs: No Transportation Needs (12/27/2023)   PRAPARE - Administrator, Civil Service (Medical): No    Lack of Transportation (Non-Medical): No  Physical Activity: Sufficiently Active (12/27/2023)   Exercise Vital Sign    Days of Exercise per Week: 4 days    Minutes of Exercise per Session: 60 min  Stress: No Stress Concern Present (12/27/2023)   [employer] of Occupational Health - Occupational Stress Questionnaire    Feeling of Stress: Only a little  Social Connections: Moderately Integrated (12/27/2023)   Social Connection and Isolation Panel    Frequency of Communication with Friends and Family: More than three times a week    Frequency of Social Gatherings with Friends and Family: Once a week    Attends Religious Services: More than 4 times per year    Active Member of [employer] or Organizations: No    Attends Banker Meetings: Not on file    Marital Status: Married  Intimate Partner Violence: Not on file    ROS  Per HPI      Objective    Ht 5' (1.524 m)   LMP 11/01/2019   BMI 21.33 kg/m   Physical Exam Constitutional:      General: She is not in acute distress.    Appearance: Normal appearance.  HENT:     Right Ear: Tympanic membrane normal.     Left Ear: Tympanic membrane normal.     Mouth/Throat:     Mouth: Mucous membranes are moist.      Pharynx: Oropharynx is clear.  Eyes:     Pupils: Pupils are equal, round, and reactive to light.  Cardiovascular:     Rate and Rhythm: Normal rate and regular rhythm.     Heart sounds: Normal heart sounds. No murmur heard.    No friction rub. No gallop.  Pulmonary:     Effort: Pulmonary effort is normal. No respiratory distress.     Breath sounds: Normal breath sounds.  Abdominal:     General: Abdomen is flat. Bowel sounds are normal.     Palpations: Abdomen is soft.  Musculoskeletal:        General: No swelling.     Cervical back:  Normal range of motion.  Lymphadenopathy:     Cervical: No cervical adenopathy.  Skin:    General: Skin is warm and dry.  Neurological:     General: No focal deficit present.     Mental Status: She is alert.  Psychiatric:        Mood and Affect: Mood normal.        Behavior: Behavior normal.        Thought Content: Thought content normal.        Assessment & Plan:   Screening mammogram for breast cancer -     3D Screening Mammogram, Left and Right; Future  Screening for viral disease -     HIV Antibody (routine testing w rflx); Future -     Hepatitis C antibody; Future  Screening for endocrine, nutritional, metabolic and immunity disorder -     VITAMIN D 25 Hydroxy (Vit-D Deficiency, Fractures); Future -     CBC with Differential/Platelet; Future -     Hemoglobin A1c; Future -     Comprehensive metabolic panel with GFR; Future -     Lipid panel; Future -     TSH; Future  Encounter to establish care Assessment & Plan: New patient establishing care after relocating and changing insurance. 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 - Schedule fasting blood work appointment for next week. - Review lab results via MyChart. - Schedule annual physical exam for next year.   Allergic asthma, severe persistent, uncomplicated Assessment & Plan: Asthma managed with Symbicort . Symptoms controlled, occasional  coughing triggered by smoke exposure. No montelukast  use due to nightmares. - Continue Symbicort . - Continue regular follow up with pulmonology as scheduled. Appreciate their recs for care.   Hyperlipidemia, unspecified hyperlipidemia type Assessment & Plan: Hyperlipidemia previously managed with Crestor, discontinued after three months for personal reasons. Lipid levels to be reassessed with upcoming lab work. - Order lipid panel with fasting blood work. - Consider restarting Crestor if lipid levels remain elevated.    Return in about 1 year (around 12/28/2024) for Physical.   Saddie JULIANNA Sacks, PA-C

## 2023-12-29 NOTE — Patient Instructions (Addendum)
 VISIT SUMMARY: You came in today for your first visit to establish care as a new patient. We discussed your asthma and high cholesterol. We also reviewed your preventive health maintenance needs.  YOUR PLAN: ESTABLISHING CARE: You are a new patient establishing care after relocating and changing insurance. -Schedule a fasting blood work appointment for next week. -Review lab results via MyChart. -Schedule your annual physical exam for next year.  ASTHMA: Your asthma is currently managed with Symbicort , and your symptoms are controlled. You experience occasional coughing triggered by smoke exposure. -Continue using Symbicort  as prescribed. -We have removed albuterol  and montelukast  from your medication list.  HYPERLIPIDEMIA: You have high cholesterol that was previously managed with Crestor, which you stopped taking after three months. -We will order a lipid panel with your fasting blood work. -If your cholesterol levels remain high, we will consider restarting Crestor.  PREVENTIVE HEALTH MAINTENANCE: We reviewed your preventive health needs, including your colonoscopy, mammogram, and Pap smear. -You had a colonoscopy two to three years ago and will need another one in about seven to eight years. -You are due for a mammogram. I have placed an order for this and the imaging center will call you to schedule it. -Your Pap smear was performed at [facility] without any issues.  If you have any problems before your next visit feel free to message me via MyChart (minor issues or questions) or call the office, otherwise you may reach out to schedule an office visit.  Thank you! Saddie Sacks, PA-C

## 2023-12-29 NOTE — Assessment & Plan Note (Signed)
 Hyperlipidemia previously managed with Crestor, discontinued after three months for personal reasons. Lipid levels to be reassessed with upcoming lab work. - Order lipid panel with fasting blood work. - Consider restarting Crestor if lipid levels remain elevated.

## 2024-01-06 ENCOUNTER — Other Ambulatory Visit

## 2024-01-06 DIAGNOSIS — Z130 Encounter for screening for diseases of the blood and blood-forming organs and certain disorders involving the immune mechanism: Secondary | ICD-10-CM | POA: Diagnosis not present

## 2024-01-06 DIAGNOSIS — Z13228 Encounter for screening for other metabolic disorders: Secondary | ICD-10-CM | POA: Diagnosis not present

## 2024-01-06 DIAGNOSIS — Z1321 Encounter for screening for nutritional disorder: Secondary | ICD-10-CM | POA: Diagnosis not present

## 2024-01-06 DIAGNOSIS — Z1159 Encounter for screening for other viral diseases: Secondary | ICD-10-CM | POA: Diagnosis not present

## 2024-01-06 DIAGNOSIS — Z1329 Encounter for screening for other suspected endocrine disorder: Secondary | ICD-10-CM | POA: Diagnosis not present

## 2024-01-07 ENCOUNTER — Ambulatory Visit: Payer: Self-pay

## 2024-01-07 ENCOUNTER — Other Ambulatory Visit: Payer: Self-pay

## 2024-01-07 DIAGNOSIS — E785 Hyperlipidemia, unspecified: Secondary | ICD-10-CM

## 2024-01-07 LAB — COMPREHENSIVE METABOLIC PANEL WITH GFR
ALT: 14 IU/L (ref 0–32)
AST: 19 IU/L (ref 0–40)
Albumin: 4.4 g/dL (ref 3.8–4.9)
Alkaline Phosphatase: 99 IU/L (ref 44–121)
BUN/Creatinine Ratio: 38 — ABNORMAL HIGH (ref 9–23)
BUN: 21 mg/dL (ref 6–24)
Bilirubin Total: 0.4 mg/dL (ref 0.0–1.2)
CO2: 20 mmol/L (ref 20–29)
Calcium: 9 mg/dL (ref 8.7–10.2)
Chloride: 107 mmol/L — ABNORMAL HIGH (ref 96–106)
Creatinine, Ser: 0.56 mg/dL — ABNORMAL LOW (ref 0.57–1.00)
Globulin, Total: 2.2 g/dL (ref 1.5–4.5)
Glucose: 88 mg/dL (ref 70–99)
Potassium: 3.9 mmol/L (ref 3.5–5.2)
Sodium: 142 mmol/L (ref 134–144)
Total Protein: 6.6 g/dL (ref 6.0–8.5)
eGFR: 108 mL/min/1.73 (ref >59)

## 2024-01-07 LAB — LIPID PANEL
Chol/HDL Ratio: 3.8 ratio (ref 0.0–4.4)
Cholesterol, Total: 250 mg/dL — ABNORMAL HIGH (ref 100–199)
HDL: 66 mg/dL (ref >39)
LDL Chol Calc (NIH): 173 mg/dL — ABNORMAL HIGH (ref 0–99)
Triglycerides: 65 mg/dL (ref 0–149)
VLDL Cholesterol Cal: 11 mg/dL (ref 5–40)

## 2024-01-07 LAB — CBC WITH DIFFERENTIAL/PLATELET
Basophils Absolute: 0.1 x10E3/uL (ref 0.0–0.2)
Basos: 1 %
EOS (ABSOLUTE): 0.3 x10E3/uL (ref 0.0–0.4)
Eos: 6 %
Hematocrit: 42.4 % (ref 34.0–46.6)
Hemoglobin: 13.3 g/dL (ref 11.1–15.9)
Immature Grans (Abs): 0 x10E3/uL (ref 0.0–0.1)
Immature Granulocytes: 0 %
Lymphocytes Absolute: 2.5 x10E3/uL (ref 0.7–3.1)
Lymphs: 54 %
MCH: 29.6 pg (ref 26.6–33.0)
MCHC: 31.4 g/dL — ABNORMAL LOW (ref 31.5–35.7)
MCV: 94 fL (ref 79–97)
Monocytes Absolute: 0.3 x10E3/uL (ref 0.1–0.9)
Monocytes: 7 %
Neutrophils Absolute: 1.4 x10E3/uL (ref 1.4–7.0)
Neutrophils: 32 %
Platelets: 276 x10E3/uL (ref 150–450)
RBC: 4.49 x10E6/uL (ref 3.77–5.28)
RDW: 12.2 % (ref 11.7–15.4)
WBC: 4.5 x10E3/uL (ref 3.4–10.8)

## 2024-01-07 LAB — HEMOGLOBIN A1C
Est. average glucose Bld gHb Est-mCnc: 117 mg/dL
Hgb A1c MFr Bld: 5.7 % — ABNORMAL HIGH (ref 4.8–5.6)

## 2024-01-07 LAB — HIV ANTIBODY (ROUTINE TESTING W REFLEX): HIV Screen 4th Generation wRfx: NONREACTIVE

## 2024-01-07 LAB — VITAMIN D 25 HYDROXY (VIT D DEFICIENCY, FRACTURES): Vit D, 25-Hydroxy: 38.6 ng/mL (ref 30.0–100.0)

## 2024-01-07 LAB — TSH: TSH: 0.738 u[IU]/mL (ref 0.450–4.500)

## 2024-01-07 LAB — HEPATITIS C ANTIBODY: Hep C Virus Ab: NONREACTIVE

## 2024-01-07 MED ORDER — ROSUVASTATIN CALCIUM 5 MG PO TABS: 5.0000 mg | ORAL_TABLET | Freq: Every day | ORAL | 2 refills | Status: AC

## 2024-01-07 NOTE — Telephone Encounter (Signed)
 Spoke with patient and declines to repeat labs. Tara Mullins is aware.

## 2024-03-22 ENCOUNTER — Other Ambulatory Visit: Payer: Self-pay | Admitting: Medical Genetics

## 2024-03-22 DIAGNOSIS — Z006 Encounter for examination for normal comparison and control in clinical research program: Secondary | ICD-10-CM

## 2024-12-28 ENCOUNTER — Encounter
